# Patient Record
Sex: Female | Born: 1955
Health system: Southern US, Community
[De-identification: ages and names within clinical notes are randomized; demographics above are authoritative.]

## PROBLEM LIST (undated history)

## (undated) DIAGNOSIS — R519 Headache, unspecified: Secondary | ICD-10-CM

## (undated) DIAGNOSIS — M8580 Other specified disorders of bone density and structure, unspecified site: Secondary | ICD-10-CM

## (undated) DIAGNOSIS — C801 Malignant (primary) neoplasm, unspecified: Secondary | ICD-10-CM

## (undated) DIAGNOSIS — H02403 Unspecified ptosis of bilateral eyelids: Secondary | ICD-10-CM

## (undated) DIAGNOSIS — R51 Headache: Secondary | ICD-10-CM

## (undated) HISTORY — PX: TONSILLECTOMY: SUR1361

## (undated) HISTORY — PX: BREAST SURGERY: SHX581

## (undated) HISTORY — PX: SPINE SURGERY: SHX786

## (undated) HISTORY — PX: COSMETIC SURGERY: SHX468

## (undated) HISTORY — PX: APPENDECTOMY: SHX54

## (undated) HISTORY — PX: FOOT SURGERY: SHX648

## (undated) HISTORY — PX: BREAST EXCISIONAL BIOPSY: SUR124

## (undated) HISTORY — PX: EYE SURGERY: SHX253

---

## 1982-04-28 HISTORY — PX: HERNIA REPAIR: SHX51

## 1993-04-28 HISTORY — PX: ABDOMINAL HYSTERECTOMY: SHX81

## 1998-04-28 HISTORY — PX: AUGMENTATION MAMMAPLASTY: SUR837

## 1999-04-29 HISTORY — PX: CERVICAL DISC SURGERY: SHX588

## 2004-11-18 ENCOUNTER — Ambulatory Visit: Payer: Self-pay | Admitting: Unknown Physician Specialty

## 2006-02-09 ENCOUNTER — Ambulatory Visit: Payer: Self-pay | Admitting: Unknown Physician Specialty

## 2006-08-31 ENCOUNTER — Ambulatory Visit: Payer: Self-pay | Admitting: General Surgery

## 2007-02-22 ENCOUNTER — Ambulatory Visit: Payer: Self-pay | Admitting: Unknown Physician Specialty

## 2008-02-24 ENCOUNTER — Ambulatory Visit: Payer: Self-pay | Admitting: Unknown Physician Specialty

## 2008-02-28 ENCOUNTER — Ambulatory Visit: Payer: Self-pay | Admitting: Unknown Physician Specialty

## 2009-04-24 ENCOUNTER — Ambulatory Visit: Payer: Self-pay | Admitting: Unknown Physician Specialty

## 2010-05-07 ENCOUNTER — Ambulatory Visit: Payer: Self-pay | Admitting: Unknown Physician Specialty

## 2010-05-15 ENCOUNTER — Ambulatory Visit: Payer: Self-pay | Admitting: Unknown Physician Specialty

## 2011-06-10 ENCOUNTER — Ambulatory Visit: Payer: Self-pay | Admitting: Unknown Physician Specialty

## 2015-03-29 NOTE — Discharge Instructions (Signed)
INSTRUCTIONS FOLLOWING OCULOPLASTIC SURGERY °AMY M. FOWLER, MD ° °AFTER YOUR EYE SURGERY, THER ARE MANY THINGS THWIHC YOU, THE PATIENT, CAN DO TO ASSURE THE BEST POSSIBLE RESULT FROM YOUR OPERATION.  THIS SHEET SHOULD BE REFERRED TO WHENEVER QUESTIONS ARISE.  IF THERE ARE ANY QUESTIONS NOT ANSWERED HERE, DO NOT HESITATE TO CALL OUR OFFICE AT [phone] OR [phone].  THERE IS ALWAYS [facility] AVAILABLE TO CALL IF QUESTIONS OR PROBLEMS ARISE. ° °VISION: Your vision may be blurred and out of focus after surgery until you are able to stop using your ointment, swelling resolves and your eye(s) heal. This may take 1 to 2 weeks at the least.  If your vision becomes gradually more dim or dark, this is not normal and you need to call our office immediately. ° °EYE CARE: For the first 48 hours after surgery, use ice packs frequently - “20 minutes on, 20 minutes off” - to help reduce swelling and bruising.  Small bags of frozen peas or corn make good ice packs along with cloths soaked in ice water.  If you are wearing a patch or other type of dressing following surgery, keep this on for the amount of time specified by your doctor.  For the first week following surgery, you will need to treat your stitches with great care.  If is OK to shower, but take care to not allow soapy water to run into your eye(s) to help reduce changes of infection.  You may gently clean the eyelashes and around the eye(s) with cotton balls and sterile water, BUT DO NOT RUB THE STITCHES VIGOROUSLY.  Keeping your stitches moist with ointment will help promote healing with minimal scar formation. ° °ACTIVITY: When you leave the surgery center, you should go home, rest and be inactive.  The eye(s) may feel scratchy and keeping the eyes closed will allow for faster healing.  The first week following surgery, avoid straining (anything making the face turn red) or lifting over 20 pounds.  Additionally, avoid bending which causes your head to go below  your waist.  Using your eyes will NOT harm them, so feel free to read, watch television, use the computer, etc as desired.  Driving depends on each individual, so check with your doctor if you have questions about driving. ° °MEDICATIONS:  You will be given a prescription for an ointment to use 4 times a day on your stitches.  You can use the ointment in your eyes if they feel scratchy or irritated.  If you eyelid(s) don’t close completely when you sleep, put some ointment in your eyes before bedtime. ° °EMERGENCY: If you experience SEVERE EYE PAIN OR HEADACHE UNRELIEVED BY TYLENOL OR PERCOCET, NAUSEA OR VOMITING, WORSENING REDNESS, OR WORSENING VISION (ESPECIALLY VISION THAT WA INITIALLY BETTER) CALL [phone] OR [phone] DURING BUSINESS HOURS OR AFTER HOURS. ° °General Anesthesia, Adult, Care After °Refer to this sheet in the next few weeks. These instructions provide you with information on caring for yourself after your procedure. Your health care provider may also give you more specific instructions. Your treatment has been planned according to current medical practices, but problems sometimes occur. Call your health care provider if you have any problems or questions after your procedure. °WHAT TO EXPECT AFTER THE PROCEDURE °After the procedure, it is typical to experience: °· Sleepiness. °· Nausea and vomiting. °HOME CARE INSTRUCTIONS °· For the first 24 hours after general anesthesia: °¨ Have a responsible person with you. °¨ Do not drive a car. If you   are alone, do not take public transportation. °¨ Do not drink alcohol. °¨ Do not take medicine that has not been prescribed by your health care provider. °¨ Do not sign important papers or make important decisions. °¨ You may resume a normal diet and activities as directed by your health care provider. °· Change bandages (dressings) as directed. °· If you have questions or problems that seem related to general anesthesia, call the hospital and ask for  the anesthetist or anesthesiologist on call. °SEEK MEDICAL CARE IF: °· You have nausea and vomiting that continue the day after anesthesia. °· You develop a rash. °SEEK IMMEDIATE MEDICAL CARE IF:  °· You have difficulty breathing. °· You have chest pain. °· You have any allergic problems. °  °This information is not intended to replace advice given to you by your health care provider. Make sure you discuss any questions you have with your health care provider. °  °Document Released: 07/21/2000 Document Revised: 05/05/2014 Document Reviewed: 08/13/2011 °Elsevier Interactive Patient Education ©2016 [employer] ° °

## 2015-04-03 ENCOUNTER — Ambulatory Visit
Admission: RE | Admit: 2015-04-03 | Discharge: 2015-04-03 | Disposition: A | Discharge status: Home / Self Care | Payer: BLUE CROSS/BLUE SHIELD | Source: Ambulatory Visit | Attending: Ophthalmology | Admitting: Ophthalmology

## 2015-04-03 ENCOUNTER — Ambulatory Visit: Payer: BLUE CROSS/BLUE SHIELD | Admitting: Anesthesiology

## 2015-04-03 ENCOUNTER — Encounter
Admission: RE | Disposition: A | Discharge status: Home / Self Care | Payer: Self-pay | Source: Ambulatory Visit | Attending: Ophthalmology

## 2015-04-03 DIAGNOSIS — H02831 Dermatochalasis of right upper eyelid: Secondary | ICD-10-CM | POA: Insufficient documentation

## 2015-04-03 DIAGNOSIS — H02834 Dermatochalasis of left upper eyelid: Secondary | ICD-10-CM | POA: Insufficient documentation

## 2015-04-03 DIAGNOSIS — H02403 Unspecified ptosis of bilateral eyelids: Secondary | ICD-10-CM | POA: Insufficient documentation

## 2015-04-03 DIAGNOSIS — Z90710 Acquired absence of both cervix and uterus: Secondary | ICD-10-CM | POA: Diagnosis not present

## 2015-04-03 HISTORY — DX: Other specified disorders of bone density and structure, unspecified site: M85.80

## 2015-04-03 HISTORY — DX: Headache, unspecified: R51.9

## 2015-04-03 HISTORY — PX: BROW LIFT: SHX178

## 2015-04-03 HISTORY — PX: PTOSIS REPAIR: SHX6568

## 2015-04-03 HISTORY — DX: Unspecified ptosis of bilateral eyelids: H02.403

## 2015-04-03 HISTORY — DX: Headache: R51

## 2015-04-03 SURGERY — BLEPHAROPLASTY
Anesthesia: Monitor Anesthesia Care | Laterality: Bilateral | Wound class: Clean

## 2015-04-03 MED ORDER — BSS IO SOLN
INTRAOCULAR | Status: DC | PRN
  Administered 2015-04-03: 15 mL via INTRAOCULAR

## 2015-04-03 MED ORDER — BACITRACIN 500 UNIT/GM OP OINT: TOPICAL_OINTMENT | OPHTHALMIC | Status: DC

## 2015-04-03 MED ORDER — PROPOFOL 500 MG/50ML IV EMUL
INTRAVENOUS | Status: DC | PRN
  Administered 2015-04-03: 50 ug/kg/min via INTRAVENOUS

## 2015-04-03 MED ORDER — BACITRACIN 500 UNIT/GM OP OINT
TOPICAL_OINTMENT | OPHTHALMIC | Status: DC | PRN
  Administered 2015-04-03: 1 via OPHTHALMIC

## 2015-04-03 MED ORDER — MIDAZOLAM HCL 2 MG/2ML IJ SOLN
INTRAMUSCULAR | Status: DC | PRN
  Administered 2015-04-03: 2 mg via INTRAVENOUS

## 2015-04-03 MED ORDER — LIDOCAINE-EPINEPHRINE 2 %-1:100000 IJ SOLN
INTRAMUSCULAR | Status: DC | PRN
  Administered 2015-04-03: 2.5 mL via OPHTHALMIC
  Administered 2015-04-03: .5 mL via OPHTHALMIC

## 2015-04-03 MED ORDER — OXYCODONE-ACETAMINOPHEN 5-325 MG PO TABS
1.0000 | ORAL_TABLET | ORAL | Status: DC | PRN
Start: 2015-04-03 — End: 2016-10-09

## 2015-04-03 MED ORDER — FENTANYL CITRATE (PF) 100 MCG/2ML IJ SOLN
INTRAMUSCULAR | Status: DC | PRN
  Administered 2015-04-03: 100 ug via INTRAVENOUS

## 2015-04-03 MED ORDER — LACTATED RINGERS IV SOLN
INTRAVENOUS | Status: DC
  Administered 2015-04-03: 11:00:00 via INTRAVENOUS

## 2015-04-03 MED ORDER — TETRACAINE HCL 0.5 % OP SOLN
OPHTHALMIC | Status: DC | PRN
  Administered 2015-04-03: 2 [drp] via OPHTHALMIC

## 2015-04-03 SURGICAL SUPPLY — 36 items
APPLICATOR COTTON TIP WD 3 STR (MISCELLANEOUS) ×4 IMPLANT
BLADE SURG 15 STRL LF DISP TIS (BLADE) ×1 IMPLANT
BLADE SURG 15 STRL SS (BLADE) ×1
CORD BIP STRL DISP 12FT (MISCELLANEOUS) ×2 IMPLANT
DRAPE HEAD BAR (DRAPES) ×2 IMPLANT
GAUZE SPONGE 4X4 12PLY STRL (GAUZE/BANDAGES/DRESSINGS) ×2 IMPLANT
GAUZE SPONGE NON-WVN 2X2 STRL (MISCELLANEOUS) ×10 IMPLANT
GLOVE SURG LX 7.0 MICRO (GLOVE) ×2
GLOVE SURG LX STRL 7.0 MICRO (GLOVE) ×2 IMPLANT
MARKER SKIN XFINE TIP W/RULER (MISCELLANEOUS) ×2 IMPLANT
NEEDLE FILTER BLUNT 18X 1/2SAF (NEEDLE) ×1
NEEDLE FILTER BLUNT 18X1 1/2 (NEEDLE) ×1 IMPLANT
NEEDLE HYPO 30X.5 LL (NEEDLE) ×4 IMPLANT
PACK DRAPE NASAL/ENT (PACKS) ×2 IMPLANT
SOL PREP PVP 2OZ (MISCELLANEOUS) ×2
SOLUTION PREP PVP 2OZ (MISCELLANEOUS) ×1 IMPLANT
SPONGE VERSALON 2X2 STRL (MISCELLANEOUS) ×10
SUT CHROMIC 4-0 (SUTURE)
SUT CHROMIC 4-0 M2 12X2 ARM (SUTURE)
SUT CHROMIC 5 0 P 3 (SUTURE) IMPLANT
SUT ETHILON 4 0 CL P 3 (SUTURE) IMPLANT
SUT MERSILENE 4-0 S-2 (SUTURE) IMPLANT
SUT PDS AB 4-0 P3 18 (SUTURE) IMPLANT
SUT PLAIN GUT (SUTURE) ×4 IMPLANT
SUT PROLENE 5 0 P 3 (SUTURE) IMPLANT
SUT PROLENE 6 0 P 1 18 (SUTURE) ×4 IMPLANT
SUT SILK 4 0 G 3 (SUTURE) IMPLANT
SUT VIC AB 5-0 P-3 18X BRD (SUTURE) IMPLANT
SUT VIC AB 5-0 P3 18 (SUTURE)
SUT VICRYL 6-0  S14 CTD (SUTURE)
SUT VICRYL 6-0 S14 CTD (SUTURE) IMPLANT
SUT VICRYL 7 0 TG140 8 (SUTURE) IMPLANT
SUTURE CHRMC 4-0 M2 12X2 ARM (SUTURE) IMPLANT
SYR 3ML LL SCALE MARK (SYRINGE) ×2 IMPLANT
SYRINGE 10CC LL (SYRINGE) ×2 IMPLANT
WATER STERILE IRR 500ML POUR (IV SOLUTION) ×2 IMPLANT

## 2015-04-03 NOTE — Interval H&P Note (Signed)
History and Physical Interval Note:  04/03/2015 11:48 AM  Lisa Chen  has presented today for surgery, with the diagnosis of H02.831 H02.834 DERMATOCHALASIS H02.403 PTOSIS OF BOTH EYELIDS  The various methods of treatment have been discussed with the patient and family. After consideration of risks, benefits and other options for treatment, the patient has consented to  Procedure(s): BLEPHAROPLASTY (Bilateral) PTOSIS REPAIR (Bilateral) as a surgical intervention .  The patient's history has been reviewed, patient examined, no change in status, stable for surgery.  I have reviewed the patient's chart and labs.  Questions were answered to the patient's satisfaction.     Vickki Muff, Fallyn Munnerlyn M

## 2015-04-03 NOTE — Anesthesia Procedure Notes (Signed)
Procedure Name: MAC Performed by: Torrie Namba Pre-anesthesia Checklist: Patient identified, Emergency Drugs available, Suction available, Timeout performed and Patient being monitored Patient Re-evaluated:Patient Re-evaluated prior to inductionOxygen Delivery Method: Nasal cannula Placement Confirmation: positive ETCO2     

## 2015-04-03 NOTE — H&P (Signed)
  See history and physical completed at the [facility] on 03/16/2015.

## 2015-04-03 NOTE — Transfer of Care (Signed)
Immediate Anesthesia Transfer of Care Note  Patient: Lisa Chen  Procedure(s) Performed: Procedure(s): BLEPHAROPLASTY (Bilateral) PTOSIS REPAIR (Bilateral)  Patient Location: PACU  Anesthesia Type: MAC  Level of Consciousness: awake, alert  and patient cooperative  Airway and Oxygen Therapy: Patient Spontanous Breathing and Patient connected to supplemental oxygen  Post-op Assessment: Post-op Vital signs reviewed, Patient's Cardiovascular Status Stable, Respiratory Function Stable, Patent Airway and No signs of Nausea or vomiting  Post-op Vital Signs: Reviewed and stable  Complications: No apparent anesthesia complications

## 2015-04-03 NOTE — Anesthesia Postprocedure Evaluation (Signed)
Anesthesia Post Note  Patient: Lisa Chen  Procedure(s) Performed: Procedure(s) (LRB): BLEPHAROPLASTY (Bilateral) PTOSIS REPAIR (Bilateral)  Patient location during evaluation: PACU Anesthesia Type: General Level of consciousness: awake and alert Pain management: pain level controlled Vital Signs Assessment: post-procedure vital signs reviewed and stable Respiratory status: spontaneous breathing, nonlabored ventilation, respiratory function stable and patient connected to nasal cannula oxygen Cardiovascular status: blood pressure returned to baseline and stable Postop Assessment: no signs of nausea or vomiting Anesthetic complications: no    Marshell Levan

## 2015-04-03 NOTE — Anesthesia Preprocedure Evaluation (Signed)
Anesthesia Evaluation  Patient identified by MRN, date of birth, ID band  Airway Mallampati: I  TM Distance: >3 FB Neck ROM: Full    Dental   Pulmonary           Cardiovascular      Neuro/Psych    GI/Hepatic   Endo/Other    Renal/GU      Musculoskeletal   Abdominal   Peds  Hematology   Anesthesia Other Findings   Reproductive/Obstetrics                             Anesthesia Physical Anesthesia Plan  ASA: I  Anesthesia Plan: MAC   Post-op Pain Management:    Induction: Intravenous  Airway Management Planned:   Additional Equipment:   Intra-op Plan:   Post-operative Plan:   Informed Consent: I have reviewed the patients History and Physical, chart, labs and discussed the procedure including the risks, benefits and alternatives for the proposed anesthesia with the patient or authorized representative who has indicated his/her understanding and acceptance.     Plan Discussed with: CRNA  Anesthesia Plan Comments:         Anesthesia Quick Evaluation

## 2015-04-03 NOTE — Op Note (Signed)
Preoperative Diagnosis:  1. Visually significant blepharoptosis both Upper Eyelid(s) 2. Visually significant dermatochalasis both Upper Eyelid(s)  Postoperative Diagnosis:  Same.  Procedure(s) Performed:   1. Blepharoptosis repair with levator aponeurosis advancement both Upper Eyelid(s) 2. Upper eyelid blepharoplasty with excess skin excision  both Upper Eyelid(s)  Teaching Surgeon: Philis Pique. Vickki Muff, M.D.  Assistants: none  Anesthesia: MAC  Specimens: None.  Estimated Blood Loss: Minimal.  Complications: None.  Operative Findings: None Dictated  Procedure:   Allergies were reviewed and the patient Erythromycin and Other.   After the risks, benefits, complications and alternatives were discussed with the patient, appropriate informed consent was obtained.  While seated in an upright position and looking in primary gaze, the mid pupillary line was marked on the upper eyelid margins bilaterally. The patient was then brought to the operating suite and reclined supine.  Timeout was conducted and the patient was sedated.  Local anesthetic consisting of a 50-50 mixture of 2% lidocaine with epinephrine and 0.75% bupivacaine with added Hylenex was injected subcutaneously to both upper eyelid(s). After adequate local was instilled, the patient was prepped and draped in the usual sterile fashion for eyelid surgery.   Attention was turned to the upper eyelids. A 9 mm upper eyelid crease incision line was marked with calipers on both upper eyelid(s).  A pinch test was used to estimate the amount of excess skin to remove and this was marked in standard blepharoplasty style fashion. Attention was turned to the  right upper eyelid. A #15 blade was used to open the premarked incision line. A skin only flap was excised and hemostasis was obtained with bipolar cautery.   Westcott scissors were then used to transect through orbicularis down to the tarsal plate. Epitarsus was dissected to create a  smooth surface to suture to. Dissection was then carried superiorly in the plane between orbicularis and orbital septum. Once the preaponeurotic fat pocket was identified, the orbital septum was opened. This revealed the levator and its aponeurosis.    Attention was then turned to the opposite eyelid where the same procedure was performed in the same manner. Hemostasis was obtained with bipolar cautery throughout.   3 interrupted 6-0 Prolene sutures were then passed partial thickness through the tarsal plates of both upper eyelid(s). These sutures were placed in line with the mid pupillary, medial limbal, and lateral limbal lines. The sutures were fixed to the levator aponeurosis and adjusted until a nice lid height and contour were achieved. Once nice symmetry was achieved, the skin incisions were closed with a running 6-0 fast absorbing plain suture. The patient tolerated the procedure well.  Bacitracin ophthalmic ointment was applied to the incision site(s) followed by ice packs. The patient was taken to the recovery area where she recovered without difficulty.  Post-Op Plan/Instructions:  The patient was instructed to use ice packs frequently for the next 48 hours. She was instructed to use bacitracin ophthalmic ointment on her incisions 4 times a day for the next 12 to 14 days. She was given a prescription for Percocet for pain control should Tylenol not be effective. She was asked to to follow up at the [facility] in [place], [place] in 2 weeks' time or sooner as needed for problems.  Teaching Surgeon Attestation: None  Levy Wellman M. Vickki Muff, M.D. Attending,Ophthalmology

## 2015-04-04 ENCOUNTER — Encounter: Payer: Self-pay | Admitting: Ophthalmology

## 2015-07-31 ENCOUNTER — Other Ambulatory Visit: Payer: Self-pay | Admitting: Unknown Physician Specialty

## 2015-07-31 DIAGNOSIS — M8580 Other specified disorders of bone density and structure, unspecified site: Secondary | ICD-10-CM

## 2015-07-31 DIAGNOSIS — Z01419 Encounter for gynecological examination (general) (routine) without abnormal findings: Secondary | ICD-10-CM | POA: Diagnosis not present

## 2015-07-31 DIAGNOSIS — Z1231 Encounter for screening mammogram for malignant neoplasm of breast: Secondary | ICD-10-CM | POA: Diagnosis not present

## 2015-08-01 ENCOUNTER — Other Ambulatory Visit: Payer: Self-pay | Admitting: Unknown Physician Specialty

## 2015-08-01 DIAGNOSIS — R928 Other abnormal and inconclusive findings on diagnostic imaging of breast: Secondary | ICD-10-CM

## 2015-08-02 ENCOUNTER — Other Ambulatory Visit: Payer: Self-pay | Admitting: *Deleted

## 2015-08-02 ENCOUNTER — Inpatient Hospital Stay
Admission: RE | Admit: 2015-08-02 | Discharge: 2015-08-02 | Disposition: A | Discharge status: Home / Self Care | Payer: Self-pay | Source: Ambulatory Visit | Attending: *Deleted | Admitting: *Deleted

## 2015-08-02 DIAGNOSIS — Z9289 Personal history of other medical treatment: Secondary | ICD-10-CM

## 2015-08-03 LAB — HM PAP SMEAR: HM Pap smear: NEGATIVE

## 2015-08-15 ENCOUNTER — Ambulatory Visit: Payer: BLUE CROSS/BLUE SHIELD

## 2015-08-16 ENCOUNTER — Ambulatory Visit
Admission: RE | Admit: 2015-08-16 | Discharge: 2015-08-16 | Disposition: A | Discharge status: Home / Self Care | Payer: BLUE CROSS/BLUE SHIELD | Source: Ambulatory Visit | Attending: Unknown Physician Specialty | Admitting: Unknown Physician Specialty

## 2015-08-16 DIAGNOSIS — M8580 Other specified disorders of bone density and structure, unspecified site: Secondary | ICD-10-CM | POA: Diagnosis not present

## 2015-08-16 DIAGNOSIS — N6001 Solitary cyst of right breast: Secondary | ICD-10-CM | POA: Insufficient documentation

## 2015-08-16 DIAGNOSIS — N6011 Diffuse cystic mastopathy of right breast: Secondary | ICD-10-CM | POA: Diagnosis not present

## 2015-08-16 DIAGNOSIS — R928 Other abnormal and inconclusive findings on diagnostic imaging of breast: Secondary | ICD-10-CM | POA: Insufficient documentation

## 2015-08-16 DIAGNOSIS — Z13820 Encounter for screening for osteoporosis: Secondary | ICD-10-CM | POA: Diagnosis not present

## 2015-08-16 DIAGNOSIS — Z9882 Breast implant status: Secondary | ICD-10-CM | POA: Diagnosis not present

## 2015-08-16 DIAGNOSIS — M8589 Other specified disorders of bone density and structure, multiple sites: Secondary | ICD-10-CM | POA: Diagnosis not present

## 2016-01-02 DIAGNOSIS — Z85820 Personal history of malignant melanoma of skin: Secondary | ICD-10-CM | POA: Diagnosis not present

## 2016-01-14 DIAGNOSIS — Z23 Encounter for immunization: Secondary | ICD-10-CM | POA: Diagnosis not present

## 2016-07-02 DIAGNOSIS — L570 Actinic keratosis: Secondary | ICD-10-CM | POA: Diagnosis not present

## 2016-07-02 DIAGNOSIS — Z85820 Personal history of malignant melanoma of skin: Secondary | ICD-10-CM | POA: Diagnosis not present

## 2016-08-06 ENCOUNTER — Telehealth: Payer: Self-pay

## 2016-08-06 NOTE — Telephone Encounter (Signed)
Since I have not seen this pt, I am unable to refill medication.  Need to have pt/doc relationship to prescribe medication.

## 2016-08-06 NOTE — Telephone Encounter (Signed)
Fax received from [facility] states that Dr. Vernie Ammons had ben giving script for Minivelle 0.0375 mcg/ 24 hr patch #8 one patch twice weekly for 30 days. Patient has app to establish care with you on 10-09-16. You have never seen patient. Pharmacy is requesting that you refill for 2 months until she establishes care here? Please advise

## 2016-08-07 NOTE — Telephone Encounter (Signed)
Called pharmacy and let them know they will call patient.

## 2016-08-08 ENCOUNTER — Telehealth: Payer: Self-pay | Admitting: Obstetrics & Gynecology

## 2016-08-08 NOTE — Telephone Encounter (Signed)
Prescription not authorized. Pt needs appointment due to Dr. Ammie Dalton is no longer with [facility]

## 2016-08-19 DIAGNOSIS — H902 Conductive hearing loss, unspecified: Secondary | ICD-10-CM | POA: Diagnosis not present

## 2016-08-19 DIAGNOSIS — H6123 Impacted cerumen, bilateral: Secondary | ICD-10-CM | POA: Diagnosis not present

## 2016-08-19 DIAGNOSIS — H903 Sensorineural hearing loss, bilateral: Secondary | ICD-10-CM | POA: Diagnosis not present

## 2016-10-09 ENCOUNTER — Encounter: Payer: Self-pay | Admitting: Internal Medicine

## 2016-10-09 ENCOUNTER — Ambulatory Visit (INDEPENDENT_AMBULATORY_CARE_PROVIDER_SITE_OTHER): Payer: BLUE CROSS/BLUE SHIELD

## 2016-10-09 ENCOUNTER — Ambulatory Visit (INDEPENDENT_AMBULATORY_CARE_PROVIDER_SITE_OTHER): Payer: BLUE CROSS/BLUE SHIELD | Admitting: Internal Medicine

## 2016-10-09 VITALS — BP 118/74 | HR 83 | Temp 98.6°F | Resp 12 | Ht 62.21 in | Wt 121.4 lb

## 2016-10-09 DIAGNOSIS — M8580 Other specified disorders of bone density and structure, unspecified site: Secondary | ICD-10-CM | POA: Diagnosis not present

## 2016-10-09 DIAGNOSIS — R1030 Lower abdominal pain, unspecified: Secondary | ICD-10-CM

## 2016-10-09 DIAGNOSIS — E7800 Pure hypercholesterolemia, unspecified: Secondary | ICD-10-CM

## 2016-10-09 DIAGNOSIS — Z79899 Other long term (current) drug therapy: Secondary | ICD-10-CM | POA: Diagnosis not present

## 2016-10-09 DIAGNOSIS — M545 Low back pain, unspecified: Secondary | ICD-10-CM

## 2016-10-09 DIAGNOSIS — Z800 Family history of malignant neoplasm of digestive organs: Secondary | ICD-10-CM

## 2016-10-09 DIAGNOSIS — R928 Other abnormal and inconclusive findings on diagnostic imaging of breast: Secondary | ICD-10-CM | POA: Diagnosis not present

## 2016-10-09 DIAGNOSIS — Z79818 Long term (current) use of other agents affecting estrogen receptors and estrogen levels: Secondary | ICD-10-CM

## 2016-10-09 LAB — LIPID PANEL
Cholesterol: 226 mg/dL — ABNORMAL HIGH (ref 0–200)
HDL: 74.6 mg/dL (ref >39.00)
LDL Cholesterol: 138 mg/dL — ABNORMAL HIGH (ref 0–99)
NONHDL: 151.47
Total CHOL/HDL Ratio: 3
Triglycerides: 66 mg/dL (ref 0.0–149.0)
VLDL: 13.2 mg/dL (ref 0.0–40.0)

## 2016-10-09 LAB — URINALYSIS, ROUTINE W REFLEX MICROSCOPIC
Bilirubin Urine: NEGATIVE
HGB URINE DIPSTICK: NEGATIVE
KETONES UR: NEGATIVE
LEUKOCYTES UA: NEGATIVE
NITRITE: NEGATIVE
RBC / HPF: NONE SEEN (ref 0–?)
SPECIFIC GRAVITY, URINE: 1.015 (ref 1.000–1.030)
Total Protein, Urine: NEGATIVE
URINE GLUCOSE: NEGATIVE
Urobilinogen, UA: 0.2 (ref 0.0–1.0)
WBC, UA: NONE SEEN (ref 0–?)
pH: 8 (ref 5.0–8.0)

## 2016-10-09 LAB — CBC WITH DIFFERENTIAL/PLATELET
Basophils Absolute: 0 10*3/uL (ref 0.0–0.1)
Basophils Relative: 0.5 % (ref 0.0–3.0)
EOS PCT: 0.9 % (ref 0.0–5.0)
Eosinophils Absolute: 0 10*3/uL (ref 0.0–0.7)
HCT: 37.9 % (ref 36.0–46.0)
HEMOGLOBIN: 12.8 g/dL (ref 12.0–15.0)
Lymphocytes Relative: 31 % (ref 12.0–46.0)
Lymphs Abs: 1.5 10*3/uL (ref 0.7–4.0)
MCHC: 33.7 g/dL (ref 30.0–36.0)
MCV: 93.7 fl (ref 78.0–100.0)
MONO ABS: 0.6 10*3/uL (ref 0.1–1.0)
Monocytes Relative: 12.7 % — ABNORMAL HIGH (ref 3.0–12.0)
Neutro Abs: 2.7 10*3/uL (ref 1.4–7.7)
Neutrophils Relative %: 54.9 % (ref 43.0–77.0)
Platelets: 262 10*3/uL (ref 150.0–400.0)
RBC: 4.04 Mil/uL (ref 3.87–5.11)
RDW: 12.2 % (ref 11.5–15.5)
WBC: 4.9 10*3/uL (ref 4.0–10.5)

## 2016-10-09 LAB — COMPREHENSIVE METABOLIC PANEL
ALBUMIN: 4.3 g/dL (ref 3.5–5.2)
ALT: 17 U/L (ref 0–35)
AST: 20 U/L (ref 0–37)
Alkaline Phosphatase: 52 U/L (ref 39–117)
BUN: 16 mg/dL (ref 6–23)
CO2: 28 mEq/L (ref 19–32)
CREATININE: 0.68 mg/dL (ref 0.40–1.20)
Calcium: 9.3 mg/dL (ref 8.4–10.5)
Chloride: 104 mEq/L (ref 96–112)
GFR: 93.07 mL/min (ref >60.00)
Glucose, Bld: 88 mg/dL (ref 70–99)
Potassium: 4.4 mEq/L (ref 3.5–5.1)
SODIUM: 138 meq/L (ref 135–145)
TOTAL PROTEIN: 7.2 g/dL (ref 6.0–8.3)
Total Bilirubin: 1 mg/dL (ref 0.2–1.2)

## 2016-10-09 LAB — TSH: TSH: 1.74 u[IU]/mL (ref 0.35–4.50)

## 2016-10-09 MED ORDER — ESTRADIOL 0.0375 MG/24HR TD PTTW: 1.0000 | MEDICATED_PATCH | TRANSDERMAL | 3 refills | Status: DC

## 2016-10-09 NOTE — Progress Notes (Signed)
Patient ID: Lisa Chen, female   DOB: 10/17/1955, 61 y.o.   MRN: [id number]   Subjective:    Patient ID: Lisa Chen, female    DOB: 02-17-1956, 61 y.o.   MRN: [id number]  HPI  Patient here to establish care.  She has a history of hypercholesterolemia.  She has been followed by Dr Lisa Chen.  She reports she is doing well.  Tries to stay active.  No chest pain.  No sob.  No acid reflux.  No abdominal pain.  Bowels moving.  She is s/p hysterectomy secondary to endometriosis.  Up to date with colonoscopy.  Had maternal uncle with colon cancer and maternal niece with colon cancer.  Some soreness in her right lower back.  Noticed after sitting a long period of time.  She is using estrogen patch.  Feels better on estrogen.  Has tried to stop previously.  Did not feel well.  Wants to remain on her estrogen.     Past Medical History:  Diagnosis Date  . Headache    3/WEEK, / DUE TO EYELIDS  . Osteopenia    NOT PROGRESSED IN LAST 10 YEARS  . Ptosis, both eyelids    UPPER   Past Surgical History:  Procedure Laterality Date  . ABDOMINAL HYSTERECTOMY  1995  . AUGMENTATION MAMMAPLASTY Bilateral 2000   SALINE  . BREAST EXCISIONAL BIOPSY Right 1990's   NEG  . BREAST SURGERY Right    BIOPSY  . BROW LIFT Bilateral 04/03/2015   Procedure: BLEPHAROPLASTY;  Surgeon: Karle Starch, MD;  Location: [place];  Service: Ophthalmology;  Laterality: Bilateral;  . [place] SURGERY  2001  . COSMETIC SURGERY     SKIN CANCER-MELANOMA ON BACK REMOVED-BASAL CELL ON CHEST ALSO  . EYE SURGERY Bilateral 2000 AND 2002   CATARACTS  . FOOT SURGERY    . HERNIA REPAIR  1984  . PTOSIS REPAIR Bilateral 04/03/2015   Procedure: PTOSIS REPAIR;  Surgeon: Karle Starch, MD;  Location: [place];  Service: Ophthalmology;  Laterality: Bilateral;  . TONSILLECTOMY     Family History  Problem Relation Age of Onset  . Arthritis Mother   . Heart disease Mother   . Diabetes Mother   . Heart  disease Maternal Grandmother   . Heart disease Maternal Grandfather   . Diabetes Maternal Grandfather   . Breast cancer Neg Hx    Social History   Social History  . Marital status: Married    Spouse name: N/A  . Number of children: N/A  . Years of education: N/A   Social History Main Topics  . Smoking status: Never Smoker  . Smokeless tobacco: Never Used  . Alcohol use 1.8 oz/week    3 Glasses of wine per week     Comment: OCCAS  . Drug use: No  . Sexual activity: Not Asked   Other Topics Concern  . None   Social History Narrative  . None    Outpatient Encounter Prescriptions as of 10/09/2016  Medication Sig  . estradiol (VIVELLE-DOT) 0.0375 MG/24HR Place 1 patch onto the skin 2 (two) times a week.  . [DISCONTINUED] estradiol (VIVELLE-DOT) 0.025 MG/24HR Place 1 patch onto the skin 2 (two) times a week.  . [DISCONTINUED] estradiol (VIVELLE-DOT) 0.0375 MG/24HR Place 1 patch onto the skin 2 (two) times a week.  . [DISCONTINUED] bacitracin ophthalmic ointment Use on sutures 4 times a day for 12-14 days  . [DISCONTINUED] estradiol (VIVELLE-DOT) 0.05 MG/24HR patch Place 1 patch  onto the skin 2 (two) times a week.  . [DISCONTINUED] Multiple Vitamin (MULTIVITAMIN) tablet Take 1 tablet by mouth daily.  . [DISCONTINUED] oxyCODONE-acetaminophen (PERCOCET) 5-325 MG tablet Take 1 tablet by mouth every 4 (four) hours as needed for severe pain.   No facility-administered encounter medications on file as of 10/09/2016.     Review of Systems  Constitutional: Negative for appetite change and unexpected weight change.  HENT: Negative for congestion and sinus pressure.   Respiratory: Negative for cough, chest tightness and shortness of breath.   Cardiovascular: Negative for chest pain, palpitations and leg swelling.  Gastrointestinal: Negative for abdominal pain, diarrhea, nausea and vomiting.  Genitourinary: Negative for difficulty urinating and dysuria.  Musculoskeletal: Negative for  back pain and joint swelling.  Skin: Negative for color change and rash.  Neurological: Negative for dizziness, light-headedness and headaches.  Psychiatric/Behavioral: Negative for agitation and dysphoric mood.       Objective:    Physical Exam  Constitutional: She appears well-developed and well-nourished. No distress.  HENT:  Nose: Nose normal.  Mouth/Throat: Oropharynx is clear and moist.  Neck: Neck supple. No thyromegaly present.  Cardiovascular: Normal rate and regular rhythm.   Pulmonary/Chest: Breath sounds normal. No respiratory distress. She has no wheezes.  Abdominal: Soft. Bowel sounds are normal. There is no tenderness.  Musculoskeletal: She exhibits no edema or tenderness.  Lymphadenopathy:    She has no cervical adenopathy.  Skin: No rash noted. No erythema.  Psychiatric: She has a normal mood and affect. Her behavior is normal.    BP 118/74 (BP Location: Left Arm, Patient Position: Sitting, Cuff Size: Normal)   Pulse 83   Temp 98.6 F (37 C) (Oral)   Resp 12   Ht 5' 2.21" (1.58 m)   Wt 121 lb 6.4 oz (55.1 kg)   SpO2 95%   BMI 22.06 kg/m  Wt Readings from Last 3 Encounters:  10/09/16 121 lb 6.4 oz (55.1 kg)  04/03/15 122 lb (55.3 kg)    Dg Bone Density  Result Date: 08/16/2015 EXAM: DUAL X-RAY ABSORPTIOMETRY (DXA) FOR BONE MINERAL DENSITY IMPRESSION: Dear Lisa Chen, Your patient Lisa Chen completed a BMD test on 08/16/2015 using the [place] (analysis version: 14.10) manufactured by [employer]. The following summarizes the results of our evaluation. PATIENT BIOGRAPHICAL: Name: Lisa Chen Patient ID: [id number] Birth Date: 15-Jun-1955 Height: 62.5 in. Gender: Female Exam Date: 08/16/2015 Weight: 119.0 lbs. Indications: Caucasian, Hysterectomy, Oophorectomy Bilateral Fractures: Treatments: Naturethroid, Vit D ASSESSMENT: The BMD measured at AP Spine L1-L4 is 0.997 g/cm2 with a T-score of -1.6. This patient is considered  osteopenic according to [facility]) criteria. Site Region Measured Measured WHO Young Adult BMD Date       Age      Classification T-score AP Spine L1-L4 08/16/2015 61.2 Osteopenia -1.6 0.997 g/cm2 AP Spine L1-L4 05/07/2010 55.9 Osteopenia -2.0 0.945 g/cm2 DualFemur Neck Left 08/16/2015 61.2 Osteopenia -1.2 0.878 g/cm2 DualFemur Neck Left 05/07/2010 55.9 Normal -0.9 0.908 g/cm2 World Health Organization [facility]) criteria for post-menopausal, Caucasian Women: Normal:       T-score at or above -1 SD Osteopenia:   T-score between -1 and -2.5 SD Osteoporosis: T-score at or below -2.5 SD RECOMMENDATIONS: [place] recommends that FDA-approved medical therapies be considered in postmenopausal women and men age 13 or older with a: 1. Hip or vertebral (clinical or morphometric) fracture. 2. T-score of < -2.5 at the spine or hip. 3. Ten-year fracture probability  by FRAX of 3% or greater for hip fracture or 20% or greater for major osteoporotic fracture. All treatment decisions require clinical judgment and consideration of individual patient factors, including patient preferences, co-morbidities, previous drug use, risk factors not captured in the FRAX model (e.g. falls, vitamin D deficiency, increased bone turnover, interval significant decline in bone density) and possible under - or over-estimation of fracture risk by FRAX. All patients should ensure an adequate intake of dietary calcium (1200 mg/d) and vitamin D (800 IU daily) unless contraindicated. FOLLOW-UP: People with diagnosed cases of osteoporosis or at high risk for fracture should have regular bone mineral density tests. For patients eligible for Medicare, routine testing is allowed once every 2 years. The testing frequency can be increased to one year for patients who have rapidly progressing disease, those who are receiving or discontinuing medical therapy to restore bone mass, or have additional risk factors. I have  reviewed this report, and agree with the above findings. [facility] Radiology Dear Lisa Chen, Your patient LORRIN BODNER completed a FRAX assessment on 08/16/2015 using the [place] (analysis version: 14.10) manufactured by [employer]. The following summarizes the results of our evaluation. PATIENT BIOGRAPHICAL: Name: Mee, Macdonnell Patient ID: [id number] Birth Date: [birth year] Height:    62.5 in. Gender:     Female    Age:        60.2       Weight:    119.0 lbs. Ethnicity:  White                            Exam Date: 08/16/2015 FRAX* RESULTS:  (version: 3.5) 10-year Probability of Fracture1 Major Osteoporotic Fracture2 Hip Fracture 6.9% 0.5% Population: [place] (Caucasian) Risk Factors: None Based on Femur (Left) Neck BMD 1 -The 10-year probability of fracture may be lower than reported if the patient has received treatment. 2 -Major Osteoporotic Fracture: Clinical Spine, Forearm, Hip or Shoulder *FRAX is a Materials engineer of the [employer] for Metabolic Bone Disease, a [place] (WHO) [facility]. ASSESSMENT: The probability of a major osteoporotic fracture is 6.9% within the next ten years. The probability of a hip fracture is 0.5% within the next ten years. . Electronically Signed   By: Ilona Sorrel M.D.   On: 08/16/2015 11:04   US Breast Ltd Uni Right Inc Axilla  Result Date: 08/16/2015 CLINICAL DATA:  Callback from screening mammogram for possible right breast mass EXAM: 2D DIGITAL DIAGNOSTIC RIGHT MAMMOGRAM WITH CAD AND ADJUNCT TOMO ULTRASOUND RIGHT BREAST COMPARISON:  Previous exam(s). ACR Breast Density Category b: There are scattered areas of fibroglandular density. FINDINGS: CC and MLO implant displaced views of the right breast were performed with tomosynthesis. On the additional views, there is a persistent asymmetry within the inferior left breast, corresponding to the possible abnormality identified on screening  mammogram. The breast parenchymal pattern in this region appears similar to the 2013 exam. In addition, an asymmetry within the subareolar right breast, middle depth appears similar to the 2010 and 2012 exams. Mammographic images were processed with CAD. On physical exam, no discrete mass is felt in the areas of concern within the inferior and subareolar right breast. Targeted ultrasound of the inferior and subareolar right breast was performed demonstrating a probable area of fibrocystic change at 6 o'clock, 3 cm from the nipple measuring 7 x 4 x 5 mm, which is thought to correspond to the  finding seen on screening mammogram within the inferior right breast. Similar-appearing smaller areas of fibrocystic change are also noted scattered throughout the imaged breast. Two simple cysts are incidentally noted at 12 o'clock, 1 cm from the nipple measuring 7 and 6 mm. No suspicious cystic or solid sonographic finding is seen in the areas of concern in the subareolar or inferior right breast. IMPRESSION: Probably benign right breast findings. RECOMMENDATION: Right diagnostic mammogram and ultrasound in 6 months. I have discussed the findings and recommendations with the patient. Results were also provided in writing at the conclusion of the visit. If applicable, a reminder letter will be sent to the patient regarding the next appointment. BI-RADS CATEGORY  3: Probably benign. Electronically Signed   By: Pamelia Hoit M.D.   On: 08/16/2015 11:06   Mm Diag Breast Tomo Uni Right  Result Date: 08/16/2015 CLINICAL DATA:  Callback from screening mammogram for possible right breast mass EXAM: 2D DIGITAL DIAGNOSTIC RIGHT MAMMOGRAM WITH CAD AND ADJUNCT TOMO ULTRASOUND RIGHT BREAST COMPARISON:  Previous exam(s). ACR Breast Density Category b: There are scattered areas of fibroglandular density. FINDINGS: CC and MLO implant displaced views of the right breast were performed with tomosynthesis. On the additional views, there is a  persistent asymmetry within the inferior left breast, corresponding to the possible abnormality identified on screening mammogram. The breast parenchymal pattern in this region appears similar to the 2013 exam. In addition, an asymmetry within the subareolar right breast, middle depth appears similar to the 2010 and 2012 exams. Mammographic images were processed with CAD. On physical exam, no discrete mass is felt in the areas of concern within the inferior and subareolar right breast. Targeted ultrasound of the inferior and subareolar right breast was performed demonstrating a probable area of fibrocystic change at 6 o'clock, 3 cm from the nipple measuring 7 x 4 x 5 mm, which is thought to correspond to the finding seen on screening mammogram within the inferior right breast. Similar-appearing smaller areas of fibrocystic change are also noted scattered throughout the imaged breast. Two simple cysts are incidentally noted at 12 o'clock, 1 cm from the nipple measuring 7 and 6 mm. No suspicious cystic or solid sonographic finding is seen in the areas of concern in the subareolar or inferior right breast. IMPRESSION: Probably benign right breast findings. RECOMMENDATION: Right diagnostic mammogram and ultrasound in 6 months. I have discussed the findings and recommendations with the patient. Results were also provided in writing at the conclusion of the visit. If applicable, a reminder letter will be sent to the patient regarding the next appointment. BI-RADS CATEGORY  3: Probably benign. Electronically Signed   By: Pamelia Hoit M.D.   On: 08/16/2015 11:06       Assessment & Plan:   Problem List Items Addressed This Visit    Abnormal mammogram - Primary    Mammogram 08/16/15 - Birads III.  Recommended right breast mammogram and ultrasound.  She never had.  Schedule for bilateral diagnostic mammogram and ultrasound.        Relevant Orders   MM DIAG BREAST TOMO BILATERAL   TSH (Completed)   Lipid panel  (Completed)   US BREAST LTD UNI LEFT INC AXILLA   US BREAST LTD UNI RIGHT INC AXILLA   Current use of estrogen therapy    She has tried to stop previously.  Feels better on estrogen.  Follow.       Family history of colon cancer    States up to date with  colonoscopy.  Obtain results.        Hypercholesterolemia    Low cholesterol diet and exercise.  Follow lipid panel.        Low back pain    Low back pain that occasionally radiates around to lower abdomen.  Check L-S spine xray.  No abdominal pain on exam.  Check urinalysis.        Relevant Orders   DG Lumbar Spine 2-3 Views (Completed)   Osteopenia    Weight bearing exercise.  On estrogen.  Calcium and vitamin D.        Other Visit Diagnoses    Lower abdominal pain       Relevant Orders   CBC with Differential/Platelet (Completed)   Comprehensive metabolic panel (Completed)   Urinalysis, Routine w reflex microscopic (Completed)     I spent 45 minutes with the patient and more than 50% of the time was spent in consultation regarding the above.  Time spent obtaining history, discussing current issues and symptoms and discussing plans for treatment and further w/up and f/u.     Einar Pheasant, MD

## 2016-10-09 NOTE — Progress Notes (Signed)
Pre-visit discussion using our clinic review tool. No additional management support is needed unless otherwise documented below in the visit note.  

## 2016-10-11 ENCOUNTER — Encounter: Payer: Self-pay | Admitting: Internal Medicine

## 2016-10-11 DIAGNOSIS — M545 Low back pain, unspecified: Secondary | ICD-10-CM | POA: Insufficient documentation

## 2016-10-11 DIAGNOSIS — M8580 Other specified disorders of bone density and structure, unspecified site: Secondary | ICD-10-CM | POA: Insufficient documentation

## 2016-10-11 DIAGNOSIS — Z79899 Other long term (current) drug therapy: Secondary | ICD-10-CM | POA: Insufficient documentation

## 2016-10-11 DIAGNOSIS — R928 Other abnormal and inconclusive findings on diagnostic imaging of breast: Secondary | ICD-10-CM | POA: Insufficient documentation

## 2016-10-11 DIAGNOSIS — M810 Age-related osteoporosis without current pathological fracture: Secondary | ICD-10-CM | POA: Insufficient documentation

## 2016-10-11 DIAGNOSIS — Z800 Family history of malignant neoplasm of digestive organs: Secondary | ICD-10-CM | POA: Insufficient documentation

## 2016-10-11 NOTE — Assessment & Plan Note (Signed)
Mammogram 08/16/15 - Birads III.  Recommended right breast mammogram and ultrasound.  She never had.  Schedule for bilateral diagnostic mammogram and ultrasound.

## 2016-10-11 NOTE — Assessment & Plan Note (Signed)
Low back pain that occasionally radiates around to lower abdomen.  Check L-S spine xray.  No abdominal pain on exam.  Check urinalysis.

## 2016-10-11 NOTE — Assessment & Plan Note (Signed)
States up to date with colonoscopy.  Obtain results.

## 2016-10-11 NOTE — Assessment & Plan Note (Signed)
She has tried to stop previously.  Feels better on estrogen.  Follow.

## 2016-10-11 NOTE — Assessment & Plan Note (Signed)
Weight bearing exercise.  On estrogen.  Calcium and vitamin D.

## 2016-10-11 NOTE — Assessment & Plan Note (Signed)
Low cholesterol diet and exercise.  Follow lipid panel.   

## 2016-10-12 ENCOUNTER — Other Ambulatory Visit: Payer: Self-pay | Admitting: Internal Medicine

## 2016-10-12 DIAGNOSIS — M545 Low back pain, unspecified: Secondary | ICD-10-CM

## 2016-10-12 NOTE — Progress Notes (Signed)
Order placed for referral to physical therapy.  

## 2016-10-28 IMAGING — US US BREAST*R* LIMITED INC AXILLA
1 series · 13 of 17 positions shown · non-contrast
Comparison: Previous exam(s).

CLINICAL DATA: Callback from screening mammogram for possible right
breast mass

EXAM:
2D DIGITAL DIAGNOSTIC RIGHT MAMMOGRAM WITH CAD AND ADJUNCT TOMO
ULTRASOUND RIGHT BREAST

[Series 1: us breast*right* limited inc axilla · 0.05mm/px · 13 of 17 slices shown]
[im 1/17]
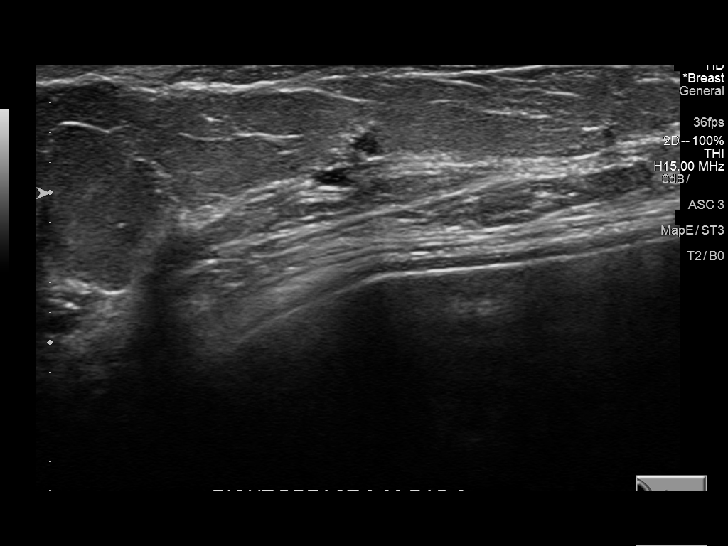
[im 2/17]
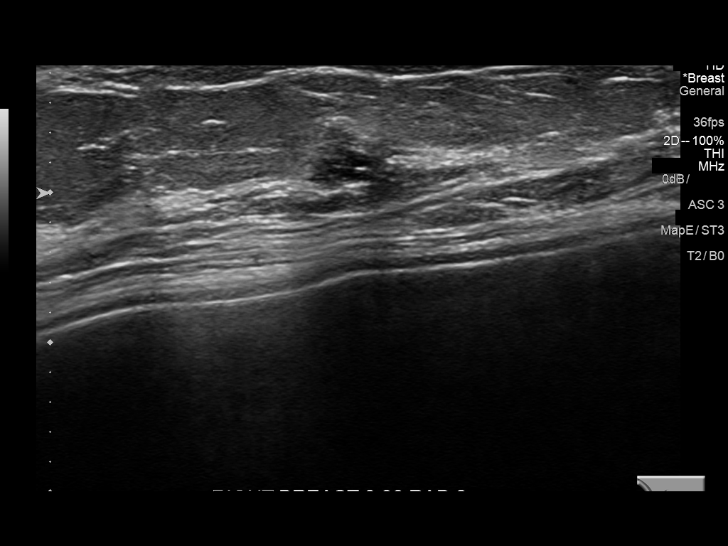
[im 4/17]
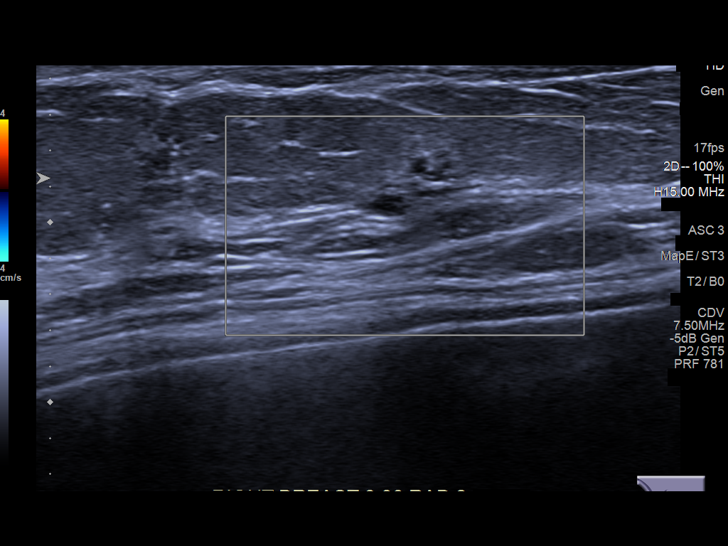
[im 5/17]
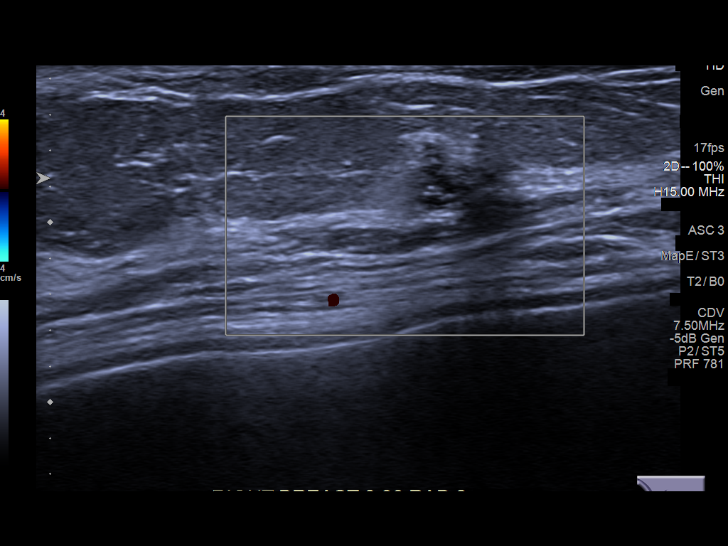
[im 6/17]
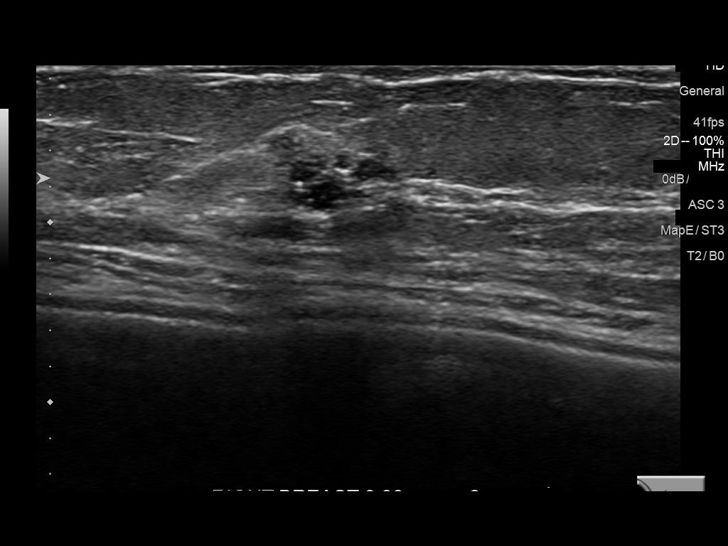
[im 8/17]
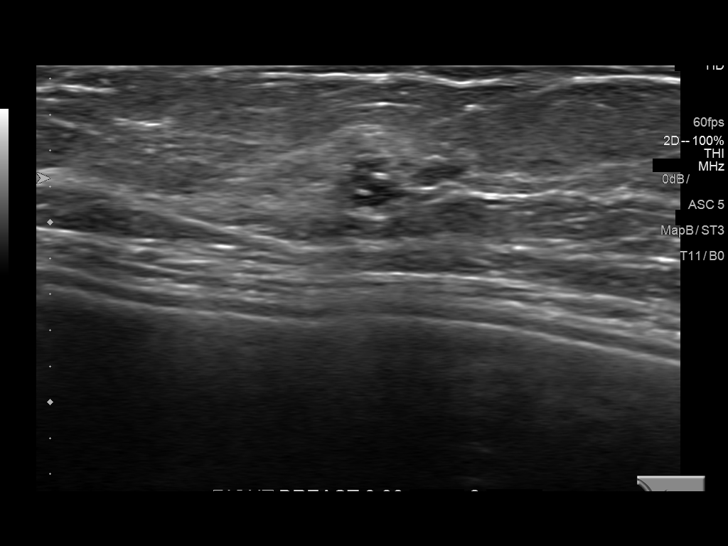
[im 9/17]
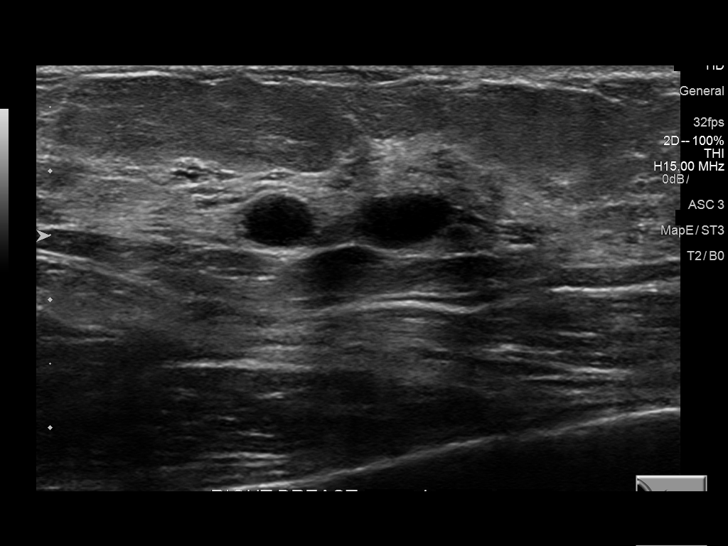
[im 10/17]
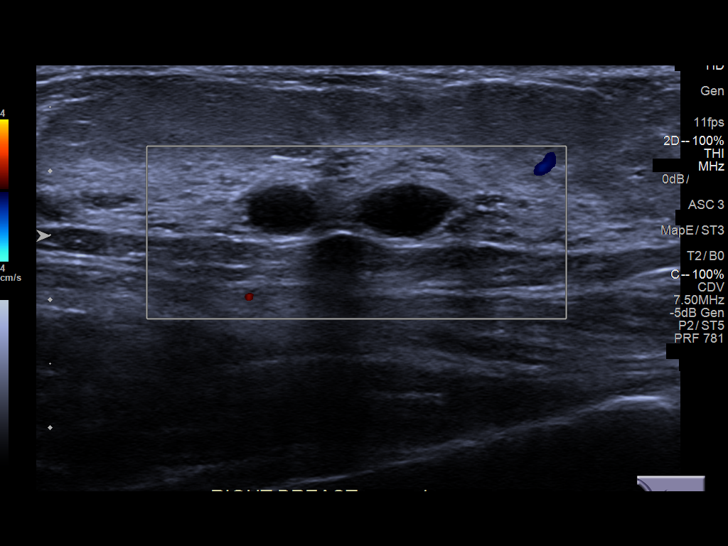
[im 12/17]
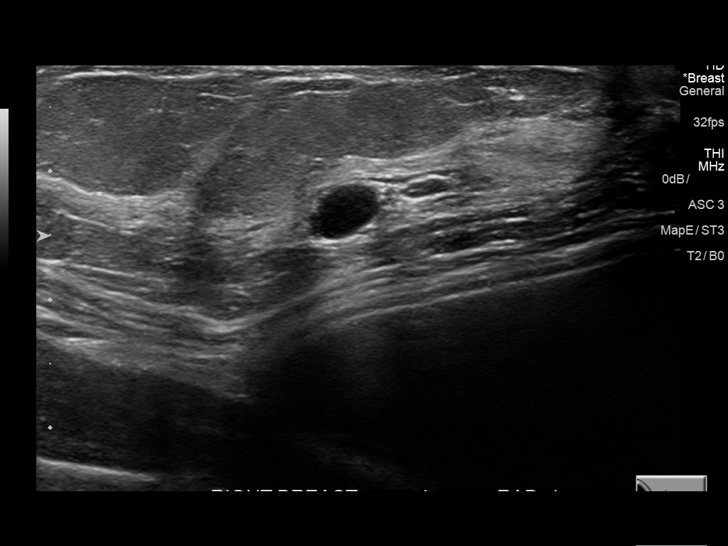
[im 13/17]
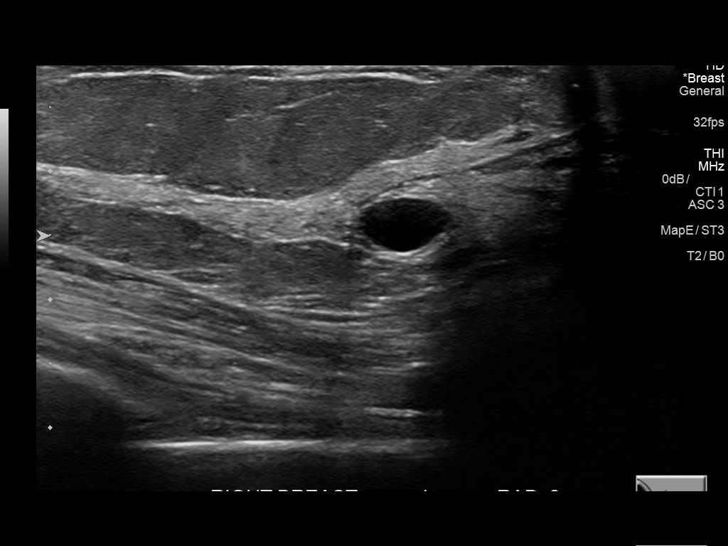
[im 14/17]
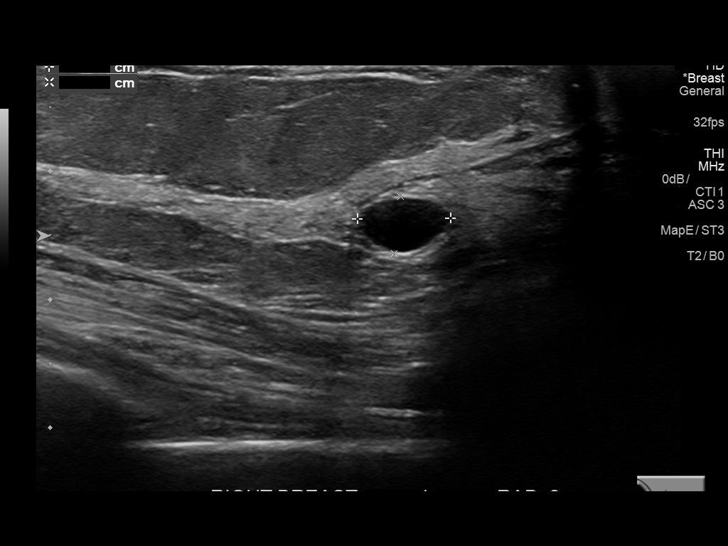
[im 16/17]
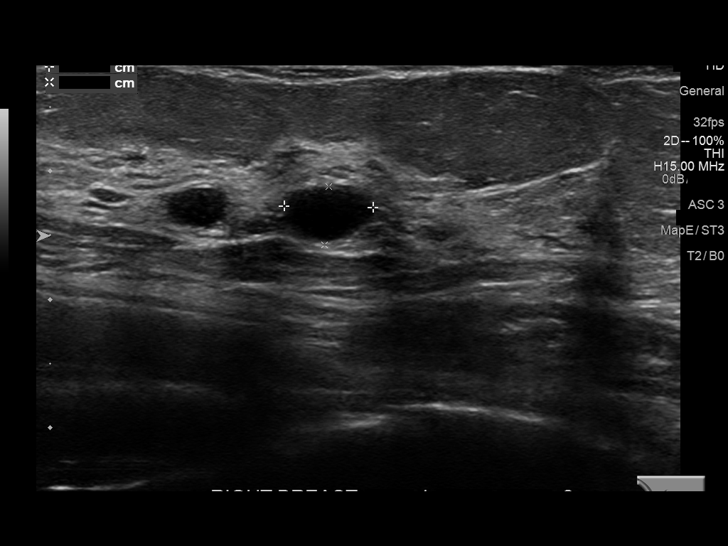
[im 17/17]
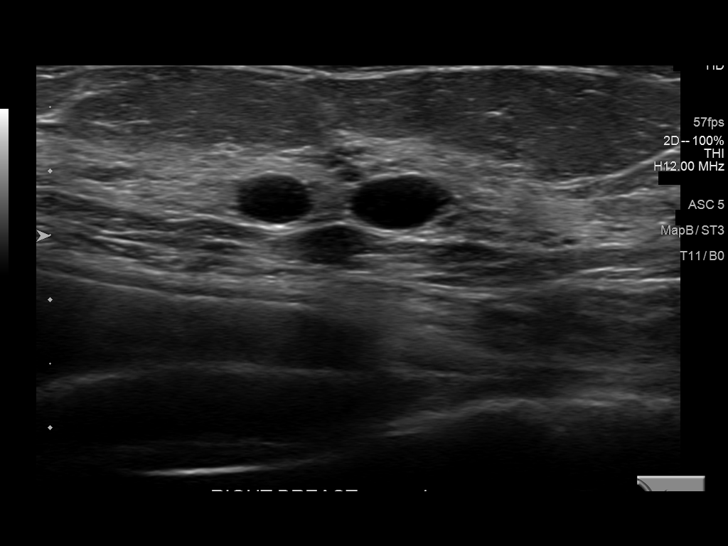

[13 of 17 positions shown; findings below may reference images not displayed]

ACR Breast Density Category b: There are scattered areas of
fibroglandular density.
FINDINGS: CC and MLO implant displaced views of the right breast were
performed with tomosynthesis. On the additional views, there is a
persistent asymmetry within the inferior left breast, corresponding
to the possible abnormality identified on screening mammogram. The
breast parenchymal pattern in this region appears similar to the
2836 exam. In addition, an asymmetry within the subareolar right
breast, middle depth appears similar to the 5787 and 6906 exams.

Mammographic images were processed with CAD.

On physical exam, no discrete mass is felt in the areas of concern
within the inferior and subareolar right breast.

Targeted ultrasound of the inferior and subareolar right breast was
performed demonstrating a probable area of fibrocystic change at 6
o'clock, 3 cm from the nipple measuring 7 x 4 x 5 mm, which is
thought to correspond to the finding seen on screening mammogram
within the inferior right breast. Similar-appearing smaller areas of
fibrocystic change are also noted scattered throughout the imaged
breast. Two simple cysts are incidentally noted at 12 o'clock, 1 cm
from the nipple measuring 7 and 6 mm. No suspicious cystic or solid
sonographic finding is seen in the areas of concern in the
subareolar or inferior right breast.
IMPRESSION: Probably benign right breast findings.

RECOMMENDATION:
Right diagnostic mammogram and ultrasound in 6 months.

I have discussed the findings and recommendations with the patient.
Results were also provided in writing at the conclusion of the
visit. If applicable, a reminder letter will be sent to the patient
regarding the next appointment.

BI-RADS CATEGORY  3: Probably benign.

## 2016-11-07 ENCOUNTER — Other Ambulatory Visit: Payer: Self-pay | Admitting: Internal Medicine

## 2016-11-07 ENCOUNTER — Ambulatory Visit
Admission: RE | Admit: 2016-11-07 | Discharge: 2016-11-07 | Disposition: A | Discharge status: Home / Self Care | Payer: BLUE CROSS/BLUE SHIELD | Source: Ambulatory Visit | Attending: Internal Medicine | Admitting: Internal Medicine

## 2016-11-07 DIAGNOSIS — N6011 Diffuse cystic mastopathy of right breast: Secondary | ICD-10-CM | POA: Diagnosis not present

## 2016-11-07 DIAGNOSIS — R928 Other abnormal and inconclusive findings on diagnostic imaging of breast: Secondary | ICD-10-CM

## 2016-12-02 ENCOUNTER — Other Ambulatory Visit: Payer: Self-pay | Admitting: Internal Medicine

## 2016-12-04 IMAGING — MG MM DIGITAL DIAGNOSTIC UNILAT*R* W/ TOMO W/ CAD
6 series · 6 of 14 positions shown · non-contrast
Comparison: Previous exam(s).

CLINICAL DATA: Callback from screening mammogram for possible right
breast mass

EXAM:
2D DIGITAL DIAGNOSTIC RIGHT MAMMOGRAM WITH CAD AND ADJUNCT TOMO
ULTRASOUND RIGHT BREAST

[R MLO]
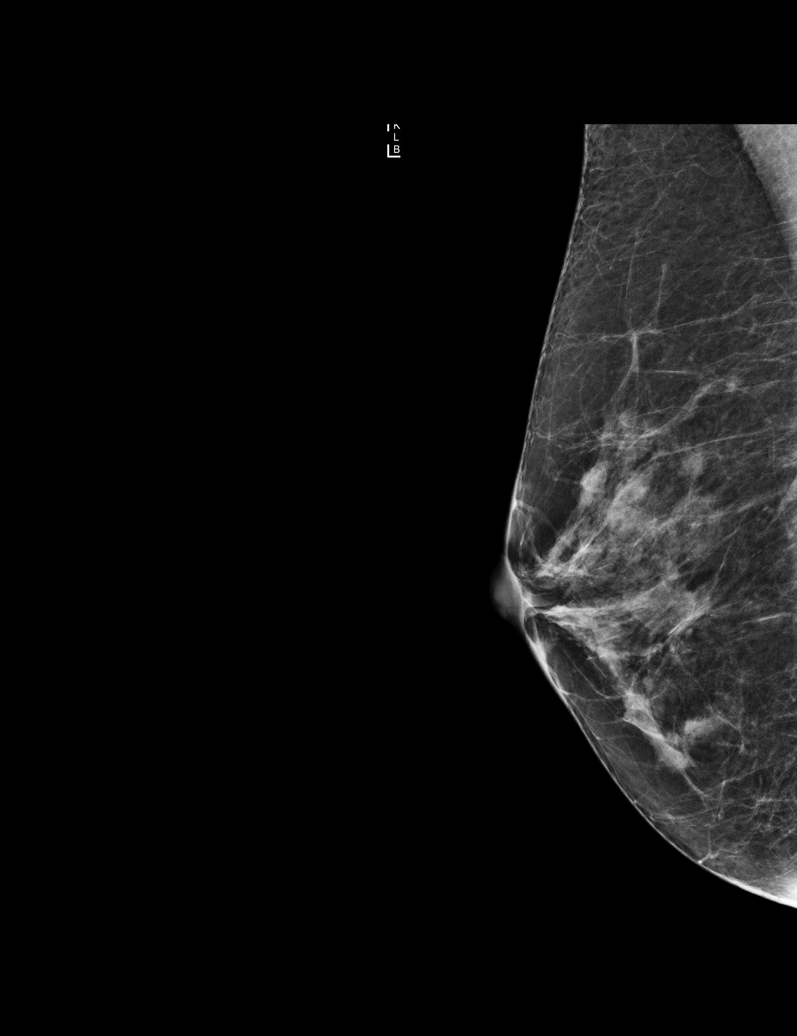

[R CC synth-2D]
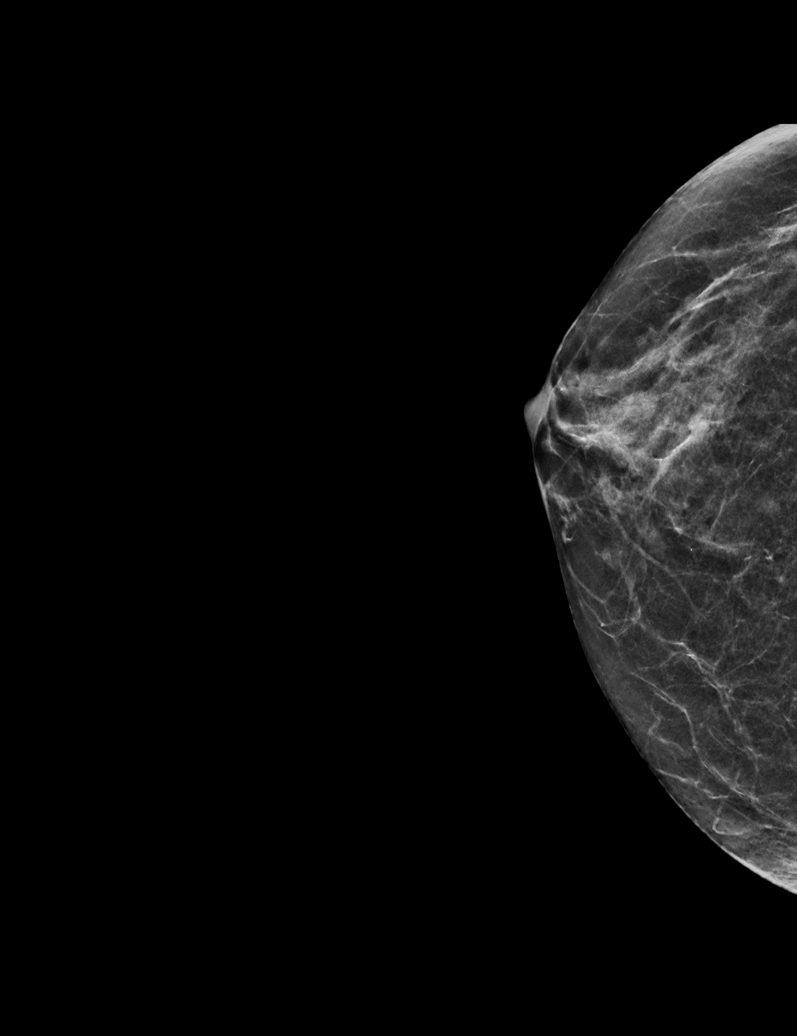

[R CC]
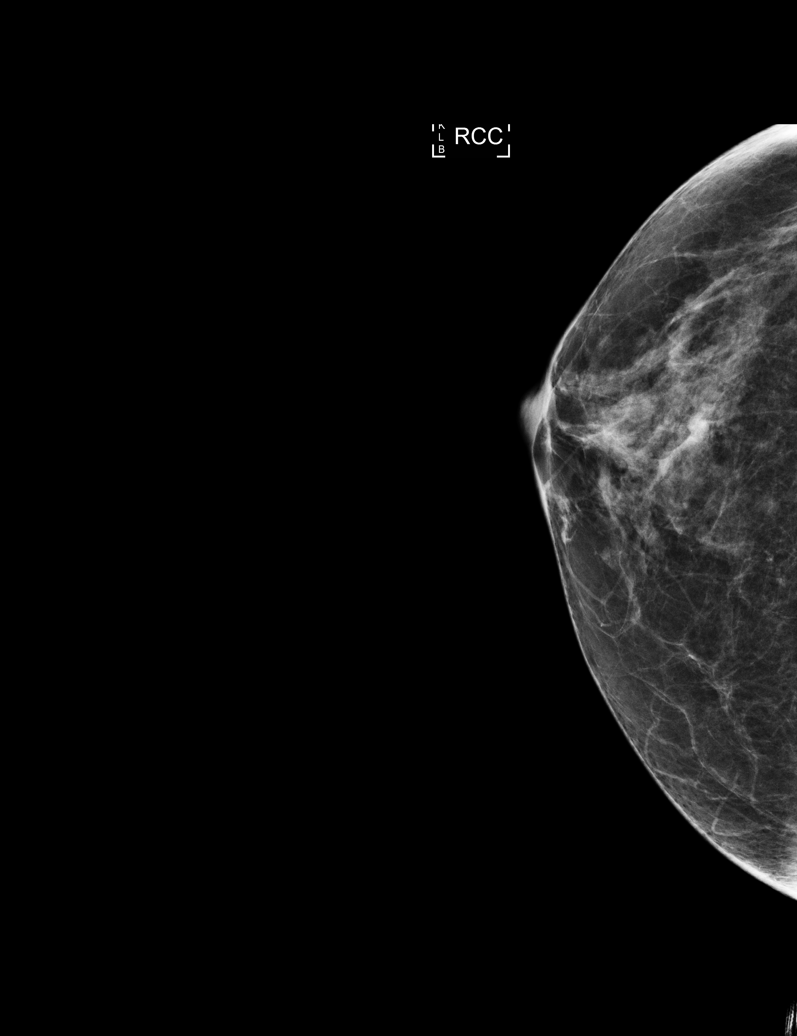

[R MLO synth-2D]
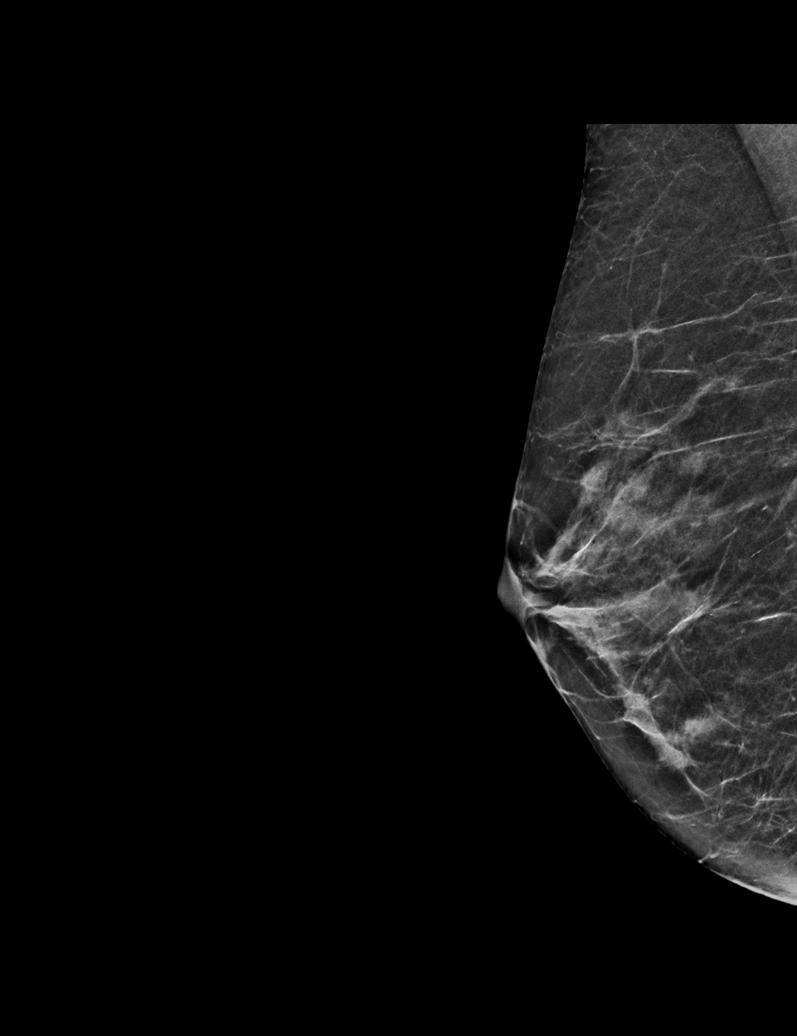

[R MLO tomo · tomo slice 21/42.0]
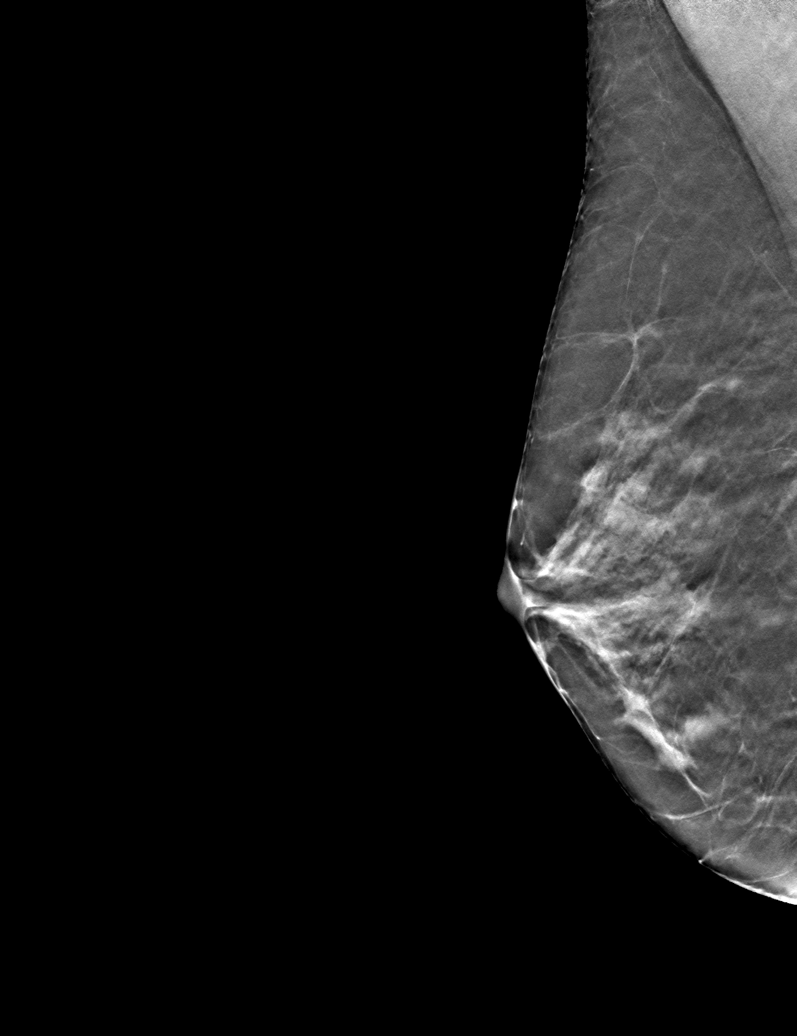

[R CC tomo · tomo slice 22/43.0]
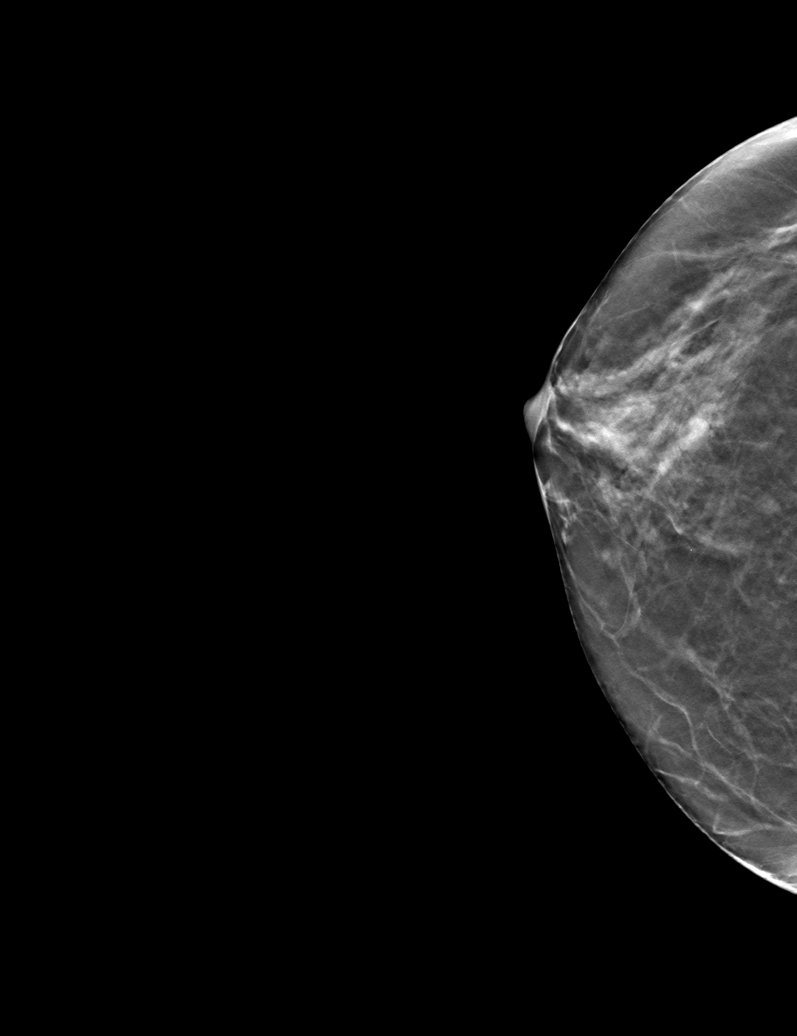

[6 of 14 positions shown; findings below may reference images not displayed]

ACR Breast Density Category b: There are scattered areas of
fibroglandular density.
FINDINGS: CC and MLO implant displaced views of the right breast were
performed with tomosynthesis. On the additional views, there is a
persistent asymmetry within the inferior left breast, corresponding
to the possible abnormality identified on screening mammogram. The
breast parenchymal pattern in this region appears similar to the
2836 exam. In addition, an asymmetry within the subareolar right
breast, middle depth appears similar to the 5787 and 6906 exams.

Mammographic images were processed with CAD.

On physical exam, no discrete mass is felt in the areas of concern
within the inferior and subareolar right breast.

Targeted ultrasound of the inferior and subareolar right breast was
performed demonstrating a probable area of fibrocystic change at 6
o'clock, 3 cm from the nipple measuring 7 x 4 x 5 mm, which is
thought to correspond to the finding seen on screening mammogram
within the inferior right breast. Similar-appearing smaller areas of
fibrocystic change are also noted scattered throughout the imaged
breast. Two simple cysts are incidentally noted at 12 o'clock, 1 cm
from the nipple measuring 7 and 6 mm. No suspicious cystic or solid
sonographic finding is seen in the areas of concern in the
subareolar or inferior right breast.
IMPRESSION: Probably benign right breast findings.

RECOMMENDATION:
Right diagnostic mammogram and ultrasound in 6 months.

I have discussed the findings and recommendations with the patient.
Results were also provided in writing at the conclusion of the
visit. If applicable, a reminder letter will be sent to the patient
regarding the next appointment.

BI-RADS CATEGORY  3: Probably benign.

## 2016-12-26 ENCOUNTER — Ambulatory Visit (INDEPENDENT_AMBULATORY_CARE_PROVIDER_SITE_OTHER): Payer: BLUE CROSS/BLUE SHIELD | Admitting: Internal Medicine

## 2016-12-26 ENCOUNTER — Other Ambulatory Visit (HOSPITAL_COMMUNITY)
Admission: RE | Admit: 2016-12-26 | Discharge: 2016-12-26 | Disposition: A | Discharge status: Home / Self Care | Payer: BLUE CROSS/BLUE SHIELD | Source: Ambulatory Visit | Attending: Internal Medicine | Admitting: Internal Medicine

## 2016-12-26 ENCOUNTER — Encounter: Payer: Self-pay | Admitting: Internal Medicine

## 2016-12-26 VITALS — BP 120/72 | HR 67 | Temp 98.4°F | Resp 12 | Ht 62.21 in | Wt 121.6 lb

## 2016-12-26 DIAGNOSIS — Z23 Encounter for immunization: Secondary | ICD-10-CM

## 2016-12-26 DIAGNOSIS — R42 Dizziness and giddiness: Secondary | ICD-10-CM

## 2016-12-26 DIAGNOSIS — M545 Low back pain, unspecified: Secondary | ICD-10-CM

## 2016-12-26 DIAGNOSIS — R109 Unspecified abdominal pain: Secondary | ICD-10-CM | POA: Insufficient documentation

## 2016-12-26 DIAGNOSIS — Z124 Encounter for screening for malignant neoplasm of cervix: Secondary | ICD-10-CM | POA: Insufficient documentation

## 2016-12-26 DIAGNOSIS — Z1211 Encounter for screening for malignant neoplasm of colon: Secondary | ICD-10-CM | POA: Diagnosis not present

## 2016-12-26 DIAGNOSIS — Z800 Family history of malignant neoplasm of digestive organs: Secondary | ICD-10-CM

## 2016-12-26 DIAGNOSIS — R140 Abdominal distension (gaseous): Secondary | ICD-10-CM

## 2016-12-26 DIAGNOSIS — Z0000 Encounter for general adult medical examination without abnormal findings: Secondary | ICD-10-CM | POA: Diagnosis not present

## 2016-12-26 DIAGNOSIS — E7800 Pure hypercholesterolemia, unspecified: Secondary | ICD-10-CM

## 2016-12-26 DIAGNOSIS — Z79899 Other long term (current) drug therapy: Secondary | ICD-10-CM

## 2016-12-26 LAB — CBC WITH DIFFERENTIAL/PLATELET
Basophils Absolute: 0 cells/uL (ref 0–200)
Basophils Relative: 0 %
EOS ABS: 66 {cells}/uL (ref 15–500)
Eosinophils Relative: 1 %
HEMATOCRIT: 38.3 % (ref 35.0–45.0)
HEMOGLOBIN: 12.6 g/dL (ref 11.7–15.5)
LYMPHS PCT: 30 %
Lymphs Abs: 1980 cells/uL (ref 850–3900)
MCH: 30.6 pg (ref 27.0–33.0)
MCHC: 32.9 g/dL (ref 32.0–36.0)
MCV: 93 fL (ref 80.0–100.0)
MPV: 9.9 fL (ref 7.5–12.5)
Monocytes Absolute: 792 cells/uL (ref 200–950)
Monocytes Relative: 12 %
NEUTROS PCT: 57 %
Neutro Abs: 3762 cells/uL (ref 1500–7800)
Platelets: 293 10*3/uL (ref 140–400)
RBC: 4.12 MIL/uL (ref 3.80–5.10)
RDW: 12.7 % (ref 11.0–15.0)
WBC: 6.6 10*3/uL (ref 3.8–10.8)

## 2016-12-26 NOTE — Progress Notes (Addendum)
Patient ID: Lisa Chen, female   DOB: 13-Apr-1956, 61 y.o.   MRN: [id number]   Subjective:    Patient ID: Lisa Chen, female    DOB: [birth year], 61 y.o.   MRN: [id number]  HPI  Patient here for her physical exam.  She reports she is doing relatively well.  She established care with me in 09/2016.  At that time was having some lower back discomfort.  See note.  She reports still having some lower back discomfort, but recently developed some lower abdominal/pelvic discomfort.  Some bloating.  Bowels are moving. No diarrhea.  She is eating.  No vomiting.  No chest pain.  Breathing stable. Also reports noticing feeling sick and had dizziness.  Noticed dizziness in the dentist chair.  Movements aggravated.  This has resolved now.  Discussed further w/up and evaluation.  She declines.  Wants to monitor.     Past Medical History:  Diagnosis Date  . Headache    3/WEEK, / DUE TO EYELIDS  . Osteopenia    NOT PROGRESSED IN LAST 10 YEARS  . Ptosis, both eyelids    UPPER   Past Surgical History:  Procedure Laterality Date  . ABDOMINAL HYSTERECTOMY  1995  . AUGMENTATION MAMMAPLASTY Bilateral 2000   SALINE  . BREAST EXCISIONAL BIOPSY Right 1990's   NEG  . BREAST SURGERY Right    BIOPSY  . BROW LIFT Bilateral 04/03/2015   Procedure: BLEPHAROPLASTY;  Surgeon: Karle Starch, MD;  Location: [place];  Service: Ophthalmology;  Laterality: Bilateral;  . [place] SURGERY  2001  . COSMETIC SURGERY     SKIN CANCER-MELANOMA ON BACK REMOVED-BASAL CELL ON CHEST ALSO  . EYE SURGERY Bilateral 2000 AND 2002   CATARACTS  . FOOT SURGERY    . HERNIA REPAIR  1984  . PTOSIS REPAIR Bilateral 04/03/2015   Procedure: PTOSIS REPAIR;  Surgeon: Karle Starch, MD;  Location: [place];  Service: Ophthalmology;  Laterality: Bilateral;  . TONSILLECTOMY     Family History  Problem Relation Age of Onset  . Arthritis Mother   . Heart disease Mother   . Diabetes Mother   . Heart disease  Maternal Grandmother   . Heart disease Maternal Grandfather   . Diabetes Maternal Grandfather   . Breast cancer Neg Hx    Social History   Social History  . Marital status: Married    Spouse name: N/A  . Number of children: N/A  . Years of education: N/A   Social History Main Topics  . Smoking status: Never Smoker  . Smokeless tobacco: Never Used  . Alcohol use 1.8 oz/week    3 Glasses of wine per week     Comment: OCCAS  . Drug use: No  . Sexual activity: Not Asked   Other Topics Concern  . None   Social History Narrative  . None    Outpatient Encounter Prescriptions as of 12/26/2016  Medication Sig  . MINIVELLE 0.0375 MG/24HR APPLY 1 PATCH TWICE WEEKLY FOR 30 DAYS   No facility-administered encounter medications on file as of 12/26/2016.     Review of Systems  Constitutional: Positive for fatigue. Negative for appetite change and unexpected weight change.  HENT: Negative for congestion and sinus pressure.   Eyes: Negative for pain and visual disturbance.  Respiratory: Negative for cough, chest tightness and shortness of breath.   Cardiovascular: Negative for chest pain, palpitations and leg swelling.  Gastrointestinal: Positive for abdominal pain. Negative for diarrhea, nausea  and vomiting.  Genitourinary: Negative for difficulty urinating and dysuria.  Musculoskeletal: Positive for back pain. Negative for joint swelling.  Skin: Negative for color change and rash.  Neurological: Positive for dizziness. Negative for headaches.  Hematological: Negative for adenopathy. Does not bruise/bleed easily.  Psychiatric/Behavioral: Negative for agitation and dysphoric mood.       Objective:    Physical Exam  Constitutional: She is oriented to person, place, and time. She appears well-developed and well-nourished. No distress.  HENT:  Nose: Nose normal.  Mouth/Throat: Oropharynx is clear and moist.  Eyes: Right eye exhibits no discharge. Left eye exhibits no discharge.  No scleral icterus.  Neck: Neck supple. No thyromegaly present.  Cardiovascular: Normal rate and regular rhythm.   Pulmonary/Chest: Breath sounds normal. No accessory muscle usage. No tachypnea. No respiratory distress. She has no decreased breath sounds. She has no wheezes. She has no rhonchi. Right breast exhibits no inverted nipple, no mass, no nipple discharge and no tenderness (no axillary adenopathy). Left breast exhibits no inverted nipple, no mass, no nipple discharge and no tenderness (no axilarry adenopathy).  Abdominal: Soft. Bowel sounds are normal.  Minimal tenderness to palpation over the lower abdomen/pelvic region - suprapubic region.  No rebound or guarding present.   Genitourinary:  Genitourinary Comments: Normal external genitalia.  Vaginal vault without lesions.  S/p hysterectomy.   Pap smear performed.  Could not appreciate any adnexal masses or tenderness.    Musculoskeletal: She exhibits no edema or tenderness.  Lymphadenopathy:    She has no cervical adenopathy.  Neurological: She is alert and oriented to person, place, and time.  Skin: Skin is warm. No rash noted. No erythema.  Psychiatric: She has a normal mood and affect. Her behavior is normal.    BP 120/72 (BP Location: Left Arm, Patient Position: Sitting, Cuff Size: Normal)   Pulse 67   Temp 98.4 F (36.9 C) (Oral)   Resp 12   Ht 5' 2.21" (1.58 m)   Wt 121 lb 9.6 oz (55.2 kg)   SpO2 98%   BMI 22.09 kg/m  Wt Readings from Last 3 Encounters:  12/26/16 121 lb 9.6 oz (55.2 kg)  10/09/16 121 lb 6.4 oz (55.1 kg)  04/03/15 122 lb (55.3 kg)     Lab Results  Component Value Date   WBC 6.6 12/26/2016   HGB 12.6 12/26/2016   HCT 38.3 12/26/2016   PLT 293 12/26/2016   GLUCOSE 85 12/26/2016   CHOL 226 (H) 10/09/2016   TRIG 66.0 10/09/2016   HDL 74.60 10/09/2016   LDLCALC 138 (H) 10/09/2016   ALT 17 12/26/2016   AST 21 12/26/2016   NA 139 12/26/2016   K 4.0 12/26/2016   CL 102 12/26/2016   CREATININE  0.75 12/26/2016   BUN 17 12/26/2016   CO2 22 12/26/2016   TSH 1.74 10/09/2016    US Breast Ltd Uni Right Inc Axilla  Result Date: 11/07/2016 CLINICAL DATA:  Annual examination of both breasts and follow-up of probably benign fibrocystic changes in the 6 o'clock position of the right breast. The patient is asymptomatic EXAM: 2D DIGITAL DIAGNOSTIC BILATERAL MAMMOGRAM WITH IMPLANTS, CAD AND ADJUNCT TOMO ULTRASOUND RIGHT BREAST The patient has retropectoral implants. Standard and implant displaced views were performed. COMPARISON:  08/16/2015, 07/31/2015, 07/20/2014, 06/22/2013 ACR Breast Density Category c: The breast tissue is heterogeneously dense, which may obscure small masses. FINDINGS: No mass, architectural distortion, or suspicious microcalcification is identified in either breast to suggest malignancy. The possible mass identified in  the lower central right breast on the screening mammogram of 07/31/2015 is less apparent on today's mammogram. Targeted ultrasound is performed, showing a cluster of cysts at 6 o'clock position 3 cm from the nipple measuring 0.6 x 0.3 x 0.5 cm. There is no associated color Doppler flow. This area of probable fibrocystic change appears smaller than it did on the prior ultrasound of April 2017, at which time it measured 0.7 x 0.4 x 0.5 cm. Mammographic images were processed with CAD. IMPRESSION: No evidence of malignancy in either breast. Small cluster of cysts/fibrocystic change in the 6 o'clock region of the right breast measures slightly smaller on today's ultrasound and is less apparent on the mammogram. RECOMMENDATION: Screening mammogram in one year.(Code:SM-B-01Y) I have discussed the findings and recommendations with the patient. Results were also provided in writing at the conclusion of the visit. If applicable, a reminder letter will be sent to the patient regarding the next appointment. BI-RADS CATEGORY  2: Benign. Electronically Signed   By: Curlene Dolphin M.D.    On: 11/07/2016 12:06   Mm Diag Breast W/implant Bilateral  Result Date: 11/07/2016 CLINICAL DATA:  Annual examination of both breasts and follow-up of probably benign fibrocystic changes in the 6 o'clock position of the right breast. The patient is asymptomatic EXAM: 2D DIGITAL DIAGNOSTIC BILATERAL MAMMOGRAM WITH IMPLANTS, CAD AND ADJUNCT TOMO ULTRASOUND RIGHT BREAST The patient has retropectoral implants. Standard and implant displaced views were performed. COMPARISON:  08/16/2015, 07/31/2015, 07/20/2014, 06/22/2013 ACR Breast Density Category c: The breast tissue is heterogeneously dense, which may obscure small masses. FINDINGS: No mass, architectural distortion, or suspicious microcalcification is identified in either breast to suggest malignancy. The possible mass identified in the lower central right breast on the screening mammogram of 07/31/2015 is less apparent on today's mammogram. Targeted ultrasound is performed, showing a cluster of cysts at 6 o'clock position 3 cm from the nipple measuring 0.6 x 0.3 x 0.5 cm. There is no associated color Doppler flow. This area of probable fibrocystic change appears smaller than it did on the prior ultrasound of April 2017, at which time it measured 0.7 x 0.4 x 0.5 cm. Mammographic images were processed with CAD. IMPRESSION: No evidence of malignancy in either breast. Small cluster of cysts/fibrocystic change in the 6 o'clock region of the right breast measures slightly smaller on today's ultrasound and is less apparent on the mammogram. RECOMMENDATION: Screening mammogram in one year.(Code:SM-B-01Y) I have discussed the findings and recommendations with the patient. Results were also provided in writing at the conclusion of the visit. If applicable, a reminder letter will be sent to the patient regarding the next appointment. BI-RADS CATEGORY  2: Benign. Electronically Signed   By: Curlene Dolphin M.D.   On: 11/07/2016 12:06       Assessment & Plan:   Problem  List Items Addressed This Visit    Abdominal pain    Abdominal pain and bloating as outlined.  Obtain labs and CT scan.        Relevant Orders   CT Abdomen Pelvis W Contrast   CBC w/Diff (Completed)   Basic Metabolic Panel (BMET) (Completed)   Hepatic function panel (Completed)   Urinalysis, Routine w reflex microscopic (Completed)   Ambulatory referral to General Surgery   Current use of estrogen therapy    She has tried to stop previously.  Did not do well when stopped.  Wants to remain on estrogen for now.       Dizziness  Better now.  Desires no further testing or evaluation.  Will follow.        Family history of colon cancer    Due colonoscopy per pt.  Refer to Dr Bary Castilla.        Health care maintenance    Physical today 12/26/16.  PAP 12/26/16.  Mammogram 11/07/16 - [facility] II.  States she is probably due colonoscopy.  Thinks she had around age 2 or 38.  Was performed by Dr Bary Castilla.        Hypercholesterolemia    Low cholesterol diet and exercise.  Follow lipid panel.        Low back pain    Xray revealed no acute bony abnormality.  With low back pain and now lower abdominal pain, pelvic pain and bloating, will obtain CT abdomen and pelvis.  Also check urine, liver panel, metabolic panel and cbc.         Other Visit Diagnoses    Routine general medical examination at a health care facility    -  Primary   Bloating       Relevant Orders   CT Abdomen Pelvis W Contrast   Ambulatory referral to General Surgery   Pap smear for cervical cancer screening       Relevant Orders   Cytology - PAP   Need for immunization against influenza       Relevant Orders   Flu Vaccine QUAD 36+ mos IM (Completed)   Colon cancer screening       Relevant Orders   Ambulatory referral to General Surgery       Einar Pheasant, MD

## 2016-12-26 NOTE — Assessment & Plan Note (Addendum)
Physical today 12/26/16.  PAP 12/26/16.  Mammogram 11/07/16 - [facility] II.  States she is probably due colonoscopy.  Thinks she had around age 61 or 42.  Was performed by Dr Bary Castilla.

## 2016-12-27 LAB — URINALYSIS, ROUTINE W REFLEX MICROSCOPIC
Bilirubin Urine: NEGATIVE
Glucose, UA: NEGATIVE
HGB URINE DIPSTICK: NEGATIVE
LEUKOCYTES UA: NEGATIVE
Nitrite: NEGATIVE
PROTEIN: NEGATIVE
Specific Gravity, Urine: 1.023 (ref 1.001–1.035)
pH: 6 (ref 5.0–8.0)

## 2016-12-27 LAB — HEPATIC FUNCTION PANEL
ALBUMIN: 4.4 g/dL (ref 3.6–5.1)
ALT: 17 U/L (ref 6–29)
AST: 21 U/L (ref 10–35)
Alkaline Phosphatase: 53 U/L (ref 33–130)
BILIRUBIN INDIRECT: 0.8 mg/dL (ref 0.2–1.2)
Bilirubin, Direct: 0.1 mg/dL (ref <=0.2)
TOTAL PROTEIN: 7 g/dL (ref 6.1–8.1)
Total Bilirubin: 0.9 mg/dL (ref 0.2–1.2)

## 2016-12-27 LAB — BASIC METABOLIC PANEL
BUN: 17 mg/dL (ref 7–25)
CALCIUM: 9.1 mg/dL (ref 8.6–10.4)
CHLORIDE: 102 mmol/L (ref 98–110)
CO2: 22 mmol/L (ref 20–32)
CREATININE: 0.75 mg/dL (ref 0.50–0.99)
GLUCOSE: 85 mg/dL (ref 65–99)
Potassium: 4 mmol/L (ref 3.5–5.3)
Sodium: 139 mmol/L (ref 135–146)

## 2016-12-29 ENCOUNTER — Encounter: Payer: Self-pay | Admitting: Internal Medicine

## 2016-12-29 DIAGNOSIS — R42 Dizziness and giddiness: Secondary | ICD-10-CM | POA: Insufficient documentation

## 2016-12-29 NOTE — Assessment & Plan Note (Signed)
Xray revealed no acute bony abnormality.  With low back pain and now lower abdominal pain, pelvic pain and bloating, will obtain CT abdomen and pelvis.  Also check urine, liver panel, metabolic panel and cbc.

## 2016-12-29 NOTE — Assessment & Plan Note (Signed)
Due colonoscopy per pt.  Refer to Dr Bary Castilla.

## 2016-12-29 NOTE — Assessment & Plan Note (Signed)
Low cholesterol diet and exercise.  Follow lipid panel.   

## 2016-12-29 NOTE — Assessment & Plan Note (Signed)
Better now.  Desires no further testing or evaluation.  Will follow.

## 2016-12-29 NOTE — Assessment & Plan Note (Signed)
Abdominal pain and bloating as outlined.  Obtain labs and CT scan.

## 2016-12-29 NOTE — Assessment & Plan Note (Signed)
She has tried to stop previously.  Did not do well when stopped.  Wants to remain on estrogen for now.

## 2016-12-30 ENCOUNTER — Encounter: Payer: Self-pay | Admitting: General Surgery

## 2016-12-30 ENCOUNTER — Other Ambulatory Visit: Payer: Self-pay | Admitting: Internal Medicine

## 2016-12-30 ENCOUNTER — Ambulatory Visit
Admission: RE | Admit: 2016-12-30 | Discharge: 2016-12-30 | Disposition: A | Discharge status: Home / Self Care | Payer: BLUE CROSS/BLUE SHIELD | Source: Ambulatory Visit | Attending: Internal Medicine | Admitting: Internal Medicine

## 2016-12-30 DIAGNOSIS — R140 Abdominal distension (gaseous): Secondary | ICD-10-CM | POA: Diagnosis not present

## 2016-12-30 DIAGNOSIS — R1031 Right lower quadrant pain: Secondary | ICD-10-CM | POA: Diagnosis not present

## 2016-12-30 DIAGNOSIS — R109 Unspecified abdominal pain: Secondary | ICD-10-CM | POA: Insufficient documentation

## 2016-12-30 HISTORY — DX: Malignant (primary) neoplasm, unspecified: C80.1

## 2016-12-30 MED ORDER — IOPAMIDOL (ISOVUE-300) INJECTION 61%
100.0000 mL | Freq: Once | INTRAVENOUS | Status: AC | PRN
  Administered 2016-12-30: 100 mL via INTRAVENOUS

## 2017-01-01 LAB — CYTOLOGY - PAP
Adequacy: ABSENT
DIAGNOSIS: NEGATIVE
HPV (WINDOPATH): NOT DETECTED

## 2017-01-02 ENCOUNTER — Encounter: Payer: Self-pay | Admitting: Internal Medicine

## 2017-01-08 DIAGNOSIS — Z85828 Personal history of other malignant neoplasm of skin: Secondary | ICD-10-CM | POA: Diagnosis not present

## 2017-01-14 ENCOUNTER — Ambulatory Visit: Payer: Self-pay | Admitting: General Surgery

## 2017-01-27 ENCOUNTER — Other Ambulatory Visit: Payer: Self-pay | Admitting: Internal Medicine

## 2017-02-11 ENCOUNTER — Encounter: Payer: Self-pay | Admitting: General Surgery

## 2017-02-11 ENCOUNTER — Ambulatory Visit (INDEPENDENT_AMBULATORY_CARE_PROVIDER_SITE_OTHER): Payer: BLUE CROSS/BLUE SHIELD | Admitting: General Surgery

## 2017-02-11 VITALS — BP 108/68 | HR 74 | Resp 12 | Ht 62.0 in | Wt 128.0 lb

## 2017-02-11 DIAGNOSIS — Z1211 Encounter for screening for malignant neoplasm of colon: Secondary | ICD-10-CM

## 2017-02-11 DIAGNOSIS — R1031 Right lower quadrant pain: Secondary | ICD-10-CM | POA: Diagnosis not present

## 2017-02-11 MED ORDER — POLYETHYLENE GLYCOL 3350 17 GM/SCOOP PO POWD: ORAL | 0 refills | Status: DC

## 2017-02-11 NOTE — Patient Instructions (Signed)
Colonoscopy, Adult A colonoscopy is an exam to look at the entire large intestine. During the exam, a lubricated, bendable tube is inserted into the anus and then passed into the rectum, colon, and other parts of the large intestine. A colonoscopy is often done as a part of normal colorectal screening or in response to certain symptoms, such as anemia, persistent diarrhea, abdominal pain, and blood in the stool. The exam can help screen for and diagnose medical problems, including:  Tumors.  Polyps.  Inflammation.  Areas of bleeding.  Tell a health care provider about:  Any allergies you have.  All medicines you are taking, including vitamins, herbs, eye drops, creams, and over-the-counter medicines.  Any problems you or family members have had with anesthetic medicines.  Any blood disorders you have.  Any surgeries you have had.  Any medical conditions you have.  Any problems you have had passing stool. What are the risks? Generally, this is a safe procedure. However, problems may occur, including:  Bleeding.  A tear in the intestine.  A reaction to medicines given during the exam.  Infection (rare).  What happens before the procedure? Eating and drinking restrictions Follow instructions from your health care provider about eating and drinking, which may include:  A few days before the procedure - follow a low-fiber diet. Avoid nuts, seeds, dried fruit, raw fruits, and vegetables.  1-3 days before the procedure - follow a clear liquid diet. Drink only clear liquids, such as clear broth or bouillon, black coffee or tea, clear juice, clear soft drinks or sports drinks, gelatin dessert, and popsicles. Avoid any liquids that contain red or purple dye.  On the day of the procedure - do not eat or drink anything during the 2 hours before the procedure, or within the time period that your health care provider recommends.  Bowel prep If you were prescribed an oral bowel prep  to clean out your colon:  Take it as told by your health care provider. Starting the day before your procedure, you will need to drink a large amount of medicated liquid. The liquid will cause you to have multiple loose stools until your stool is almost clear or light green.  If your skin or anus gets irritated from diarrhea, you may use these to relieve the irritation: ? Medicated wipes, such as adult wet wipes with aloe and vitamin E. ? A skin soothing-product like petroleum jelly.  If you vomit while drinking the bowel prep, take a break for up to 60 minutes and then begin the bowel prep again. If vomiting continues and you cannot take the bowel prep without vomiting, call your health care provider.  General instructions  Ask your health care provider about changing or stopping your regular medicines. This is especially important if you are taking diabetes medicines or blood thinners.  Plan to have someone take you home from the hospital or clinic. What happens during the procedure?  An IV tube may be inserted into one of your veins.  You will be given medicine to help you relax (sedative).  To reduce your risk of infection: ? Your health care team will wash or sanitize their hands. ? Your anal area will be washed with soap.  You will be asked to lie on your side with your knees bent.  Your health care provider will lubricate a long, thin, flexible tube. The tube will have a camera and a light on the end.  The tube will be inserted into your   anus.  The tube will be gently eased through your rectum and colon.  Air will be delivered into your colon to keep it open. You may feel some pressure or cramping.  The camera will be used to take images during the procedure.  A small tissue sample may be removed from your body to be examined under a microscope (biopsy). If any potential problems are found, the tissue will be sent to a lab for testing.  If small polyps are found, your  health care provider may remove them and have them checked for cancer cells.  The tube that was inserted into your anus will be slowly removed. The procedure may vary among health care providers and hospitals. What happens after the procedure?  Your blood pressure, heart rate, breathing rate, and blood oxygen level will be monitored until the medicines you were given have worn off.  Do not drive for 24 hours after the exam.  You may have a small amount of blood in your stool.  You may pass gas and have mild abdominal cramping or bloating due to the air that was used to inflate your colon during the exam.  It is up to you to get the results of your procedure. Ask your health care provider, or the department performing the procedure, when your results will be ready. This information is not intended to replace advice given to you by your health care provider. Make sure you discuss any questions you have with your health care provider. Document Released: 04/11/2000 Document Revised: 02/13/2016 Document Reviewed: 06/26/2015 Elsevier Interactive Patient Education  2018 Elsevier Inc.  

## 2017-02-11 NOTE — Progress Notes (Signed)
Patient ID: Lisa Chen, female   DOB: 07/14/1955, 61 y.o.   MRN: [id number]  Chief Complaint  Patient presents with  . Colonoscopy    HPI Lisa Chen is a 61 y.o. female.  Patient is here today to discuss Colonoscopy. Patient's last colonoscopy was 2008. Bowels move everyday without difficulty. Denies blood in stools. CT of abdomen and pelvis was done on 12/30/16 due to right lower back pain radiating to right lower quadrant of abdomen since June. Lumbar x-rays completed 10/09/16. Occasional tender to touch. Sitting makes pain worst. Walking and standing does not make worse. Denies muscle spasms. Pain in back has now resolved. HPI  Past Medical History:  Diagnosis Date  . Cancer ([place])    melanoma  . Headache    3/WEEK, / DUE TO EYELIDS  . Osteopenia    NOT PROGRESSED IN LAST 10 YEARS  . Ptosis, both eyelids    UPPER    Past Surgical History:  Procedure Laterality Date  . ABDOMINAL HYSTERECTOMY  1995  . AUGMENTATION MAMMAPLASTY Bilateral 2000   SALINE  . BREAST EXCISIONAL BIOPSY Right 1990's   NEG  . BREAST SURGERY Right    BIOPSY  . BROW LIFT Bilateral 04/03/2015   Procedure: BLEPHAROPLASTY;  Surgeon: Karle Starch, MD;  Location: [place];  Service: Ophthalmology;  Laterality: Bilateral;  . [place] SURGERY  2001  . COSMETIC SURGERY     SKIN CANCER-MELANOMA ON BACK REMOVED-BASAL CELL ON CHEST ALSO  . EYE SURGERY Bilateral 2000 AND 2002   CATARACTS  . FOOT SURGERY    . HERNIA REPAIR  1984  . PTOSIS REPAIR Bilateral 04/03/2015   Procedure: PTOSIS REPAIR;  Surgeon: Karle Starch, MD;  Location: [place];  Service: Ophthalmology;  Laterality: Bilateral;  . TONSILLECTOMY      Family History  Problem Relation Age of Onset  . Arthritis Mother   . Heart disease Mother   . Diabetes Mother   . Heart disease Maternal Grandmother   . Heart disease Maternal Grandfather   . Diabetes Maternal Grandfather   . Colon cancer Cousin   . Colon cancer  Maternal Uncle   . Breast cancer Neg Hx     Social History Social History  Substance Use Topics  . Smoking status: Never Smoker  . Smokeless tobacco: Never Used  . Alcohol use 1.8 oz/week    3 Glasses of wine per week     Comment: OCCAS    Allergies  Allergen Reactions  . Erythromycin Other (See Comments)    SEVERE GASTRITIS  . Other     VICRYL SUTURES CAUSED INFLAMMATION    Current Outpatient Prescriptions  Medication Sig Dispense Refill  . MINIVELLE 0.0375 MG/24HR APPLY 1 PATCH TWICE WEEKLY FOR 30 DAYS AS DIRECTED 8 patch 0  . polyethylene glycol powder (GLYCOLAX/MIRALAX) powder 255 grams one bottle for colonoscopy prep 255 g 0   No current facility-administered medications for this visit.     Review of Systems Review of Systems  Constitutional: Negative.   Respiratory: Negative.   Cardiovascular: Negative.   Gastrointestinal: Negative for constipation and diarrhea.  Neurological: Positive for headaches.    Blood pressure 108/68, pulse 74, resp. rate 12, height 5\' 2"  (1.575 m), weight 128 lb (58.1 kg).  Physical Exam Physical Exam  Constitutional: She is oriented to person, place, and time. She appears well-developed and well-nourished.  HENT:  Mouth/Throat: Oropharynx is clear and moist.  Eyes: Conjunctivae are normal. No scleral icterus.  Neck: Neck supple.  Cardiovascular: Normal rate, regular rhythm and normal heart sounds.   Pulmonary/Chest: Effort normal and breath sounds normal.  Abdominal: Soft. Normal appearance and bowel sounds are normal. There is no tenderness.  Lymphadenopathy:    She has no cervical adenopathy.  Neurological: She is alert and oriented to person, place, and time.  Skin: Skin is warm and dry.  Psychiatric: Her behavior is normal.    Data Reviewed CT of the abdomen and pelvis dated 12/30/2016 was reviewed independently. No pathologic process identified.  Laboratory studies dated 02/25/2017 reviewed. Normal hepatic function  panel. Normal electrolytes, creatinine 0.75, normal CBC with a hemoglobin of 12.6 with an MCV of 93 and white blood cell count of 6600. Platelet count 293,000.  Pathology from her 10/21/1994 removal of the right tube and ovary and appendix was notable for endometriosis of the ovary and evidence of acute appendicitis.   Assessment    Abdominal pain without clear etiology on clinical exam or CT.  Candidate for colon screening.    Plan    Options for management reviewed. Previous colonoscopy in May 2008 was normal. The patient's family necessarily place her at high risk precluding non-colonoscopy screening options.  Discussed Cologuard as an option.   Colonoscopy with possible biopsy/polypectomy prn: Information regarding the procedure, including its potential risks and complications (including but not limited to perforation of the bowel, which may require emergency surgery to repair, and bleeding) was verbally given to the patient. Educational information regarding lower intestinal endoscopy was given to the patient. Written instructions for how to complete the bowel prep using Miralax were provided. The importance of drinking ample fluids to avoid dehydration as a result of the prep emphasized.  HPI, Physical Exam, Assessment and Plan have been scribed under the direction and in the presence of Hervey Ard, MD.  Reece Packer, RN  I have completed the exam and reviewed the above documentation for accuracy and completeness.  I agree with the above.  Haematologist has been used and any errors in dictation or transcription are unintentional.  Hervey Ard, M.D., F.A.C.S.   Robert Bellow 02/11/2017, 9:20 PM   Patient has been scheduled for a colonoscopy on 04-01-17 at [facility]. Miralax prescription has been sent in to the patient's pharmacy today. Colonoscopy instructions have been reviewed with the patient. This patient is aware to call the office if they have further  questions.   Dominga Ferry, CMA

## 2017-02-25 ENCOUNTER — Encounter: Payer: Self-pay | Admitting: General Surgery

## 2017-02-27 ENCOUNTER — Ambulatory Visit: Payer: BLUE CROSS/BLUE SHIELD | Admitting: Internal Medicine

## 2017-03-10 ENCOUNTER — Telehealth: Payer: Self-pay

## 2017-03-10 NOTE — Telephone Encounter (Signed)
Patient called and needed to reschedule her colonoscopy for 04/01/17. She would like to get it done next year and her insurance will remain the same.   The patient is scheduled for a Colonoscopy at [facility] on 06/24/17. They are aware to call the day before to get their arrival time. She has already picked up her Miralax prescription. The patient is aware of date and instructions. Endoscopy notified of date change.

## 2017-05-04 DIAGNOSIS — H60339 Swimmer's ear, unspecified ear: Secondary | ICD-10-CM | POA: Diagnosis not present

## 2017-05-20 ENCOUNTER — Encounter: Payer: Self-pay | Admitting: Internal Medicine

## 2017-05-20 ENCOUNTER — Ambulatory Visit: Payer: BLUE CROSS/BLUE SHIELD | Admitting: Internal Medicine

## 2017-05-20 VITALS — BP 124/60 | HR 75 | Temp 98.0°F | Ht 62.0 in | Wt 126.8 lb

## 2017-05-20 DIAGNOSIS — Z79899 Other long term (current) drug therapy: Secondary | ICD-10-CM | POA: Diagnosis not present

## 2017-05-20 DIAGNOSIS — E7800 Pure hypercholesterolemia, unspecified: Secondary | ICD-10-CM | POA: Diagnosis not present

## 2017-05-20 DIAGNOSIS — R109 Unspecified abdominal pain: Secondary | ICD-10-CM

## 2017-05-20 DIAGNOSIS — H60331 Swimmer's ear, right ear: Secondary | ICD-10-CM | POA: Diagnosis not present

## 2017-05-20 LAB — BASIC METABOLIC PANEL
BUN: 22 mg/dL (ref 6–23)
CHLORIDE: 104 meq/L (ref 96–112)
CO2: 23 meq/L (ref 19–32)
Calcium: 9.4 mg/dL (ref 8.4–10.5)
Creatinine, Ser: 0.68 mg/dL (ref 0.40–1.20)
GFR: 92.89 mL/min (ref >60.00)
Glucose, Bld: 92 mg/dL (ref 70–99)
POTASSIUM: 4.7 meq/L (ref 3.5–5.1)
SODIUM: 139 meq/L (ref 135–145)

## 2017-05-20 LAB — LIPID PANEL
CHOL/HDL RATIO: 3
Cholesterol: 227 mg/dL — ABNORMAL HIGH (ref 0–200)
HDL: 80 mg/dL (ref >39.00)
LDL CALC: 134 mg/dL — AB (ref 0–99)
NONHDL: 146.72
Triglycerides: 65 mg/dL (ref 0.0–149.0)
VLDL: 13 mg/dL (ref 0.0–40.0)

## 2017-05-20 LAB — HEPATIC FUNCTION PANEL
ALBUMIN: 4.2 g/dL (ref 3.5–5.2)
ALK PHOS: 54 U/L (ref 39–117)
ALT: 19 U/L (ref 0–35)
AST: 26 U/L (ref 0–37)
Bilirubin, Direct: 0.1 mg/dL (ref 0.0–0.3)
Total Bilirubin: 1 mg/dL (ref 0.2–1.2)
Total Protein: 7.4 g/dL (ref 6.0–8.3)

## 2017-05-20 MED ORDER — ESTRADIOL 0.0375 MG/24HR TD PTTW: MEDICATED_PATCH | TRANSDERMAL | 1 refills | Status: DC

## 2017-05-20 NOTE — Progress Notes (Signed)
Pre visit review using our clinic review tool, if applicable. No additional management support is needed unless otherwise documented below in the visit note. 

## 2017-05-20 NOTE — Progress Notes (Signed)
Patient ID: Lisa Chen, female   DOB: 1955/09/19, 62 y.o.   MRN: [id number]   Subjective:    Patient ID: Lisa Chen, female    DOB: 1955-05-03, 62 y.o.   MRN: [id number]  HPI  Patient here for a scheduled follow up.  She was recently evaluated by Dr Kathyrn Sheriff for right ear swelling and infection.  Also reported right side of face was swollen. States diagnosed with swimmer's ear.  Placed on oral abx and drops.  She is better.  Still has some issues with the ear.  States if she lies on her ear, notices aching.  Also reports persistent itching in her right and now left ear.  Persistent symptoms, but better.  No increased sinus congestion. No chest congestion or increased cough.  No chest pain or sob.  No acid reflux.  No abdominal pain now.  Bowels moving.  Planning for colonoscopy in 05/2017.    Past Medical History:  Diagnosis Date  . Cancer ([place])    melanoma  . Headache    3/WEEK, / DUE TO EYELIDS  . Osteopenia    NOT PROGRESSED IN LAST 10 YEARS  . Ptosis, both eyelids    UPPER   Past Surgical History:  Procedure Laterality Date  . ABDOMINAL HYSTERECTOMY  1995  . AUGMENTATION MAMMAPLASTY Bilateral 2000   SALINE  . BREAST EXCISIONAL BIOPSY Right 1990's   NEG  . BREAST SURGERY Right    BIOPSY  . BROW LIFT Bilateral 04/03/2015   Procedure: BLEPHAROPLASTY;  Surgeon: Karle Starch, MD;  Location: [place];  Service: Ophthalmology;  Laterality: Bilateral;  . [place] SURGERY  2001  . COSMETIC SURGERY     SKIN CANCER-MELANOMA ON BACK REMOVED-BASAL CELL ON CHEST ALSO  . EYE SURGERY Bilateral 2000 AND 2002   CATARACTS  . FOOT SURGERY    . HERNIA REPAIR  1984  . PTOSIS REPAIR Bilateral 04/03/2015   Procedure: PTOSIS REPAIR;  Surgeon: Karle Starch, MD;  Location: [facility];  Service: Ophthalmology;  Laterality: Bilateral;  . TONSILLECTOMY     Family History  Problem Relation Age of Onset  . Arthritis Mother   . Heart disease Mother   . Diabetes Mother    . Heart disease Maternal Grandmother   . Heart disease Maternal Grandfather   . Diabetes Maternal Grandfather   . Colon cancer Cousin   . Colon cancer Maternal Uncle   . Breast cancer Neg Hx    Social History   Socioeconomic History  . Marital status: Married    Spouse name: None  . Number of children: None  . Years of education: None  . Highest education level: None  Social Needs  . Financial resource strain: None  . Food insecurity - worry: None  . Food insecurity - inability: None  . Transportation needs - medical: None  . Transportation needs - non-medical: None  Occupational History  . None  Tobacco Use  . Smoking status: Never Smoker  . Smokeless tobacco: Never Used  Substance and Sexual Activity  . Alcohol use: Yes    Alcohol/week: 1.8 oz    Types: 3 Glasses of wine per week    Comment: OCCAS  . Drug use: No  . Sexual activity: None  Other Topics Concern  . None  Social History Narrative  . None    Outpatient Encounter Medications as of 05/20/2017  Medication Sig  . estradiol (MINIVELLE) 0.0375 MG/24HR APPLY 1 PATCH TWICE WEEKLY FOR 30 DAYS  AS DIRECTED  . [DISCONTINUED] MINIVELLE 0.0375 MG/24HR APPLY 1 PATCH TWICE WEEKLY FOR 30 DAYS AS DIRECTED  . [DISCONTINUED] polyethylene glycol powder (GLYCOLAX/MIRALAX) powder 255 grams one bottle for colonoscopy prep   No facility-administered encounter medications on file as of 05/20/2017.     Review of Systems  Constitutional: Negative for appetite change and unexpected weight change.  HENT: Negative for congestion and sinus pressure.        Ear swelling as outlined.  Improved. Still with some issues, including itching.    Respiratory: Negative for cough, chest tightness and shortness of breath.   Cardiovascular: Negative for chest pain, palpitations and leg swelling.  Gastrointestinal: Negative for abdominal pain, diarrhea and nausea.  Genitourinary: Negative for difficulty urinating and dysuria.    Musculoskeletal: Negative for joint swelling and myalgias.  Skin: Negative for color change and rash.  Neurological: Negative for dizziness, light-headedness and headaches.  Psychiatric/Behavioral: Negative for agitation and dysphoric mood.       Objective:    Physical Exam  Constitutional: She appears well-developed. No distress.  HENT:  Nose: Nose normal.  Mouth/Throat: Oropharynx is clear and moist.  Left ear - clear.  Right ear - no increased erythema.    Eyes: Conjunctivae are normal. Right eye exhibits no discharge. Left eye exhibits no discharge.  Neck: Neck supple. No thyromegaly present.  Cardiovascular: Normal rate and regular rhythm.  Pulmonary/Chest: Breath sounds normal. No respiratory distress. She has no wheezes.  Abdominal: Soft. Bowel sounds are normal. There is no tenderness.  Musculoskeletal: She exhibits no edema or tenderness.  Lymphadenopathy:    She has no cervical adenopathy.  Skin: No rash noted. No erythema.  Psychiatric: She has a normal mood and affect. Her behavior is normal.    BP 124/60   Pulse 75   Temp 98 F (36.7 C) (Oral)   Ht 5\' 2"  (1.575 m)   Wt 126 lb 12.8 oz (57.5 kg)   SpO2 98%   BMI 23.19 kg/m  Wt Readings from Last 3 Encounters:  05/20/17 126 lb 12.8 oz (57.5 kg)  02/11/17 128 lb (58.1 kg)  12/26/16 121 lb 9.6 oz (55.2 kg)     Lab Results  Component Value Date   WBC 6.6 12/26/2016   HGB 12.6 12/26/2016   HCT 38.3 12/26/2016   PLT 293 12/26/2016   GLUCOSE 92 05/20/2017   CHOL 227 (H) 05/20/2017   TRIG 65.0 05/20/2017   HDL 80.00 05/20/2017   LDLCALC 134 (H) 05/20/2017   ALT 19 05/20/2017   AST 26 05/20/2017   NA 139 05/20/2017   K 4.7 05/20/2017   CL 104 05/20/2017   CREATININE 0.68 05/20/2017   BUN 22 05/20/2017   CO2 23 05/20/2017   TSH 1.74 10/09/2016       Assessment & Plan:   Problem List Items Addressed This Visit    Abdominal pain    Appears to be doing better.  Has colonoscopy planned in 05/2017.         Current use of estrogen therapy    Discussed with her today.  Wants to remain on estrogen.        Hypercholesterolemia - Primary    Low cholesterol diet and exercise.  Follow lipid panel.        Relevant Orders   Lipid panel (Completed)   Hepatic function panel (Completed)   Basic metabolic panel (Completed)   Swimmer's ear    Increased pain and swelling as outlined.  Saw ENT.  Treated  with drops and oral abx.  Better.  Decreased swelling.  Still some itching and some discomfort if lies on her ear.  Extend out drops.  Have ENT reevaluate.        Relevant Orders   Ambulatory referral to ENT       Einar Pheasant, MD

## 2017-05-21 ENCOUNTER — Encounter: Payer: Self-pay | Admitting: Internal Medicine

## 2017-05-23 ENCOUNTER — Encounter: Payer: Self-pay | Admitting: Internal Medicine

## 2017-05-23 DIAGNOSIS — H60339 Swimmer's ear, unspecified ear: Secondary | ICD-10-CM | POA: Insufficient documentation

## 2017-05-23 NOTE — Assessment & Plan Note (Signed)
Increased pain and swelling as outlined.  Saw ENT.  Treated with drops and oral abx.  Better.  Decreased swelling.  Still some itching and some discomfort if lies on her ear.  Extend out drops.  Have ENT reevaluate.

## 2017-05-23 NOTE — Assessment & Plan Note (Signed)
Discussed with her today.  Wants to remain on estrogen.

## 2017-05-23 NOTE — Assessment & Plan Note (Signed)
Appears to be doing better.  Has colonoscopy planned in 05/2017.

## 2017-05-23 NOTE — Assessment & Plan Note (Signed)
Low cholesterol diet and exercise.  Follow lipid panel.   

## 2017-06-15 ENCOUNTER — Encounter: Payer: Self-pay | Admitting: *Deleted

## 2017-06-24 ENCOUNTER — Ambulatory Visit
Admission: RE | Admit: 2017-06-24 | Discharge: 2017-06-24 | Disposition: A | Discharge status: Home / Self Care | Payer: BLUE CROSS/BLUE SHIELD | Source: Ambulatory Visit | Attending: General Surgery | Admitting: General Surgery

## 2017-06-24 ENCOUNTER — Ambulatory Visit: Payer: BLUE CROSS/BLUE SHIELD | Admitting: Anesthesiology

## 2017-06-24 ENCOUNTER — Encounter
Admission: RE | Disposition: A | Discharge status: Home / Self Care | Payer: Self-pay | Source: Ambulatory Visit | Attending: General Surgery

## 2017-06-24 DIAGNOSIS — Z85820 Personal history of malignant melanoma of skin: Secondary | ICD-10-CM | POA: Insufficient documentation

## 2017-06-24 DIAGNOSIS — Z91048 Other nonmedicinal substance allergy status: Secondary | ICD-10-CM | POA: Insufficient documentation

## 2017-06-24 DIAGNOSIS — K5730 Diverticulosis of large intestine without perforation or abscess without bleeding: Secondary | ICD-10-CM | POA: Diagnosis not present

## 2017-06-24 DIAGNOSIS — M8580 Other specified disorders of bone density and structure, unspecified site: Secondary | ICD-10-CM | POA: Diagnosis not present

## 2017-06-24 DIAGNOSIS — Z79899 Other long term (current) drug therapy: Secondary | ICD-10-CM | POA: Diagnosis not present

## 2017-06-24 DIAGNOSIS — Z1211 Encounter for screening for malignant neoplasm of colon: Secondary | ICD-10-CM | POA: Diagnosis not present

## 2017-06-24 DIAGNOSIS — Z881 Allergy status to other antibiotic agents status: Secondary | ICD-10-CM | POA: Insufficient documentation

## 2017-06-24 DIAGNOSIS — K5790 Diverticulosis of intestine, part unspecified, without perforation or abscess without bleeding: Secondary | ICD-10-CM | POA: Diagnosis not present

## 2017-06-24 HISTORY — PX: COLONOSCOPY WITH PROPOFOL: SHX5780

## 2017-06-24 SURGERY — COLONOSCOPY WITH PROPOFOL
Anesthesia: General

## 2017-06-24 MED ORDER — PROPOFOL 10 MG/ML IV BOLUS
INTRAVENOUS | Status: DC | PRN
  Administered 2017-06-24: 40 mg via INTRAVENOUS
  Administered 2017-06-24: 20 mg via INTRAVENOUS

## 2017-06-24 MED ORDER — PROPOFOL 500 MG/50ML IV EMUL
INTRAVENOUS | Status: DC | PRN
  Administered 2017-06-24: 100 ug/kg/min via INTRAVENOUS

## 2017-06-24 MED ORDER — SODIUM CHLORIDE 0.9 % IV SOLN
INTRAVENOUS | Status: DC
  Administered 2017-06-24 (×2): via INTRAVENOUS

## 2017-06-24 MED ORDER — PROPOFOL 500 MG/50ML IV EMUL
INTRAVENOUS | Status: AC
  Filled 2017-06-24: qty 50

## 2017-06-24 NOTE — H&P (Signed)
IVANA NICASTRO [id number] Apr 29, 1955     HPI: Healthy 62 year old woman for screening colonoscopy.  She reports she tolerated the prep well.   Medications Prior to Admission  Medication Sig Dispense Refill Last Dose  . estradiol (MINIVELLE) 0.0375 MG/24HR APPLY 1 PATCH TWICE WEEKLY FOR 30 DAYS AS DIRECTED 8 patch 1 06/22/2017   Allergies  Allergen Reactions  . Erythromycin Other (See Comments)    SEVERE GASTRITIS  . Other     VICRYL SUTURES CAUSED INFLAMMATION   Past Medical History:  Diagnosis Date  . Cancer ([place])    melanoma  . Headache    3/WEEK, / DUE TO EYELIDS  . Osteopenia    NOT PROGRESSED IN LAST 10 YEARS  . Ptosis, both eyelids    UPPER   Past Surgical History:  Procedure Laterality Date  . ABDOMINAL HYSTERECTOMY  1995  . AUGMENTATION MAMMAPLASTY Bilateral 2000   SALINE  . BREAST EXCISIONAL BIOPSY Right 1990's   NEG  . BREAST SURGERY Right    BIOPSY  . BROW LIFT Bilateral 04/03/2015   Procedure: BLEPHAROPLASTY;  Surgeon: Karle Starch, MD;  Location: [place] [facility];  Service: Ophthalmology;  Laterality: Bilateral;  . [place] SURGERY  2001  . COSMETIC SURGERY     SKIN CANCER-MELANOMA ON BACK REMOVED-BASAL CELL ON CHEST ALSO  . EYE SURGERY Bilateral 2000 AND 2002   CATARACTS  . FOOT SURGERY    . HERNIA REPAIR  1984  . PTOSIS REPAIR Bilateral 04/03/2015   Procedure: PTOSIS REPAIR;  Surgeon: Karle Starch, MD;  Location: [facility];  Service: Ophthalmology;  Laterality: Bilateral;  . TONSILLECTOMY     Social History   Socioeconomic History  . Marital status: Married    Spouse name: Not on file  . Number of children: Not on file  . Years of education: Not on file  . Highest education level: Not on file  Social Needs  . Financial resource strain: Not on file  . Food insecurity - worry: Not on file  . Food insecurity - inability: Not on file  . Transportation needs - medical: Not on file  . Transportation needs - non-medical: Not on  file  Occupational History  . Not on file  Tobacco Use  . Smoking status: Never Smoker  . Smokeless tobacco: Never Used  Substance and Sexual Activity  . Alcohol use: Yes    Alcohol/week: 1.8 oz    Types: 3 Glasses of wine per week    Comment: OCCAS. none last 24hrs  . Drug use: No  . Sexual activity: Not on file  Other Topics Concern  . Not on file  Social History Narrative  . Not on file   Social History   Social History Narrative  . Not on file     ROS: Negative.     PE: HEENT: Negative. Lungs: Clear. Cardio: RR.  Assessment/Plan:  Proceed with planned endoscopy.  Forest Gleason [facility] 06/24/2017

## 2017-06-24 NOTE — Anesthesia Preprocedure Evaluation (Signed)
Anesthesia Evaluation  Patient identified by MRN, date of birth, ID band Patient awake    Reviewed: Allergy & Precautions, NPO status , Patient's Chart, lab work & pertinent test results  History of Anesthesia Complications Negative for: history of anesthetic complications  Airway Mallampati: II  TM Distance: >3 FB Neck ROM: Full    Dental no notable dental hx.    Pulmonary neg pulmonary ROS, neg sleep apnea, neg COPD,    breath sounds clear to auscultation- rhonchi (-) wheezing      Cardiovascular Exercise Tolerance: Good (-) hypertension(-) CAD and (-) Past MI  Rhythm:Regular Rate:Normal - Systolic murmurs and - Diastolic murmurs    Neuro/Psych  Headaches, negative psych ROS   GI/Hepatic negative GI ROS, Neg liver ROS,   Endo/Other  negative endocrine ROSneg diabetes  Renal/GU negative Renal ROS     Musculoskeletal negative musculoskeletal ROS (+)   Abdominal (+) - obese,   Peds  Hematology negative hematology ROS (+)   Anesthesia Other Findings Past Medical History: No date: Cancer ([place])     Comment:  melanoma No date: Headache     Comment:  3/WEEK, / DUE TO EYELIDS No date: Osteopenia     Comment:  NOT PROGRESSED IN LAST 10 YEARS No date: Ptosis, both eyelids     Comment:  UPPER   Reproductive/Obstetrics                             Anesthesia Physical Anesthesia Plan  ASA: II  Anesthesia Plan: General   Post-op Pain Management:    Induction: Intravenous  PONV Risk Score and Plan: 2 and Propofol infusion  Airway Management Planned: Natural Airway  Additional Equipment:   Intra-op Plan:   Post-operative Plan:   Informed Consent: I have reviewed the patients History and Physical, chart, labs and discussed the procedure including the risks, benefits and alternatives for the proposed anesthesia with the patient or authorized representative who has indicated his/her  understanding and acceptance.   Dental advisory given  Plan Discussed with: CRNA and Anesthesiologist  Anesthesia Plan Comments:         Anesthesia Quick Evaluation

## 2017-06-24 NOTE — Anesthesia Post-op Follow-up Note (Signed)
Anesthesia QCDR form completed.        

## 2017-06-24 NOTE — Anesthesia Postprocedure Evaluation (Signed)
Anesthesia Post Note  Patient: Lisa Chen  Procedure(s) Performed: COLONOSCOPY WITH PROPOFOL (N/A )  Patient location during evaluation: Endoscopy Anesthesia Type: General Level of consciousness: awake and alert and oriented Pain management: pain level controlled Vital Signs Assessment: post-procedure vital signs reviewed and stable Respiratory status: spontaneous breathing, nonlabored ventilation and respiratory function stable Cardiovascular status: blood pressure returned to baseline and stable Postop Assessment: no signs of nausea or vomiting Anesthetic complications: no     Last Vitals:  Vitals:   06/24/17 0909 06/24/17 0919  BP: 103/70 112/68  Pulse: 72 78  Resp: 14 16  Temp:    SpO2: 100%     Last Pain:  Vitals:   06/24/17 0849  TempSrc: Tympanic                 Jarrah Babich

## 2017-06-24 NOTE — Transfer of Care (Signed)
Immediate Anesthesia Transfer of Care Note  Patient: Lisa Chen  Procedure(s) Performed: COLONOSCOPY WITH PROPOFOL (N/A )  Patient Location: PACU  Anesthesia Type:General  Level of Consciousness: awake, alert  and oriented  Airway & Oxygen Therapy: Patient Spontanous Breathing  Post-op Assessment: Report given to RN and Post -op Vital signs reviewed and stable  Post vital signs: Reviewed and stable  Last Vitals:  Vitals:   06/24/17 0801 06/24/17 0849  BP: 111/73 (!) 85/33  Pulse: 83 87  Resp: 16 16  Temp: (!) 36.3 C 36.4 C  SpO2: 100% 100%    Last Pain:  Vitals:   06/24/17 0849  TempSrc: Tympanic         Complications: No apparent anesthesia complications

## 2017-06-24 NOTE — Op Note (Signed)
[facility] Gastroenterology Patient Name: Lisa Chen Procedure Date: 06/24/2017 8:23 AM MRN: [id number] Account #: [id number] Date of Birth: May 14, 1955 Admit Type: Outpatient Age: 62 Room: [facility] ENDO ROOM 1 Gender: Female Note Status: Finalized Procedure:            Colonoscopy Indications:          Screening for colorectal malignant neoplasm Providers:            Robert Bellow, MD Referring MD:         Einar Pheasant, MD (Referring MD) Medicines:            Monitored Anesthesia Care Complications:        No immediate complications. Procedure:            Pre-Anesthesia Assessment:                       - Prior to the procedure, a History and Physical was                        performed, and patient medications, allergies and                        sensitivities were reviewed. The patient's tolerance of                        previous anesthesia was reviewed.                       - The risks and benefits of the procedure and the                        sedation options and risks were discussed with the                        patient. All questions were answered and informed                        consent was obtained.                       After obtaining informed consent, the colonoscope was                        passed under direct vision. Throughout the procedure,                        the patient's blood pressure, pulse, and oxygen                        saturations were monitored continuously. The                        Colonoscope was introduced through the anus and                        advanced to the the cecum, identified by appendiceal                        orifice and ileocecal valve. The colonoscopy was  performed without difficulty. The patient tolerated the                        procedure well. The quality of the bowel preparation                        was excellent. Findings:      A few small-mouthed  diverticula were found in the sigmoid colon.      The retroflexed view of the distal rectum and anal verge was normal and       showed no anal or rectal abnormalities. Impression:           - Diverticulosis in the sigmoid colon.                       - The distal rectum and anal verge are normal on                        retroflexion view.                       - No specimens collected. Recommendation:       - Repeat colonoscopy in 10 years for screening purposes. Procedure Code(s):    --- Professional ---                       [phone], Colonoscopy, flexible; diagnostic, including                        collection of specimen(s) by brushing or washing, when                        performed (separate procedure) Diagnosis Code(s):    --- Professional ---                       Z12.11, Encounter for screening for malignant neoplasm                        of colon                       K57.30, Diverticulosis of large intestine without                        perforation or abscess without bleeding CPT copyright 2016 [employer]. All rights reserved. The codes documented in this report are preliminary and upon coder review may  be revised to meet current compliance requirements. Robert Bellow, MD 06/24/2017 8:45:34 AM This report has been signed electronically. Number of Addenda: 0 Note Initiated On: 06/24/2017 8:23 AM Scope Withdrawal Time: 0 hours 6 minutes 59 seconds  Total Procedure Duration: 0 hours 14 minutes 20 seconds       [facility]

## 2017-06-25 ENCOUNTER — Encounter: Payer: Self-pay | Admitting: General Surgery

## 2017-07-09 ENCOUNTER — Encounter: Payer: Self-pay | Admitting: General Surgery

## 2017-07-09 DIAGNOSIS — Z85828 Personal history of other malignant neoplasm of skin: Secondary | ICD-10-CM | POA: Diagnosis not present

## 2017-08-11 ENCOUNTER — Other Ambulatory Visit: Payer: Self-pay | Admitting: Internal Medicine

## 2017-08-20 DIAGNOSIS — L574 Cutis laxa senilis: Secondary | ICD-10-CM | POA: Diagnosis not present

## 2017-11-24 ENCOUNTER — Other Ambulatory Visit: Payer: Self-pay | Admitting: Internal Medicine

## 2017-12-24 DIAGNOSIS — L574 Cutis laxa senilis: Secondary | ICD-10-CM | POA: Diagnosis not present

## 2017-12-30 ENCOUNTER — Encounter: Payer: Self-pay | Admitting: Internal Medicine

## 2017-12-30 ENCOUNTER — Ambulatory Visit (INDEPENDENT_AMBULATORY_CARE_PROVIDER_SITE_OTHER): Payer: BLUE CROSS/BLUE SHIELD | Admitting: Internal Medicine

## 2017-12-30 VITALS — BP 106/62 | HR 80 | Temp 97.9°F | Resp 18 | Ht 62.0 in | Wt 122.2 lb

## 2017-12-30 DIAGNOSIS — Z1231 Encounter for screening mammogram for malignant neoplasm of breast: Secondary | ICD-10-CM

## 2017-12-30 DIAGNOSIS — Z23 Encounter for immunization: Secondary | ICD-10-CM

## 2017-12-30 DIAGNOSIS — Z79818 Long term (current) use of other agents affecting estrogen receptors and estrogen levels: Secondary | ICD-10-CM

## 2017-12-30 DIAGNOSIS — Z0000 Encounter for general adult medical examination without abnormal findings: Secondary | ICD-10-CM

## 2017-12-30 DIAGNOSIS — Z13220 Encounter for screening for lipoid disorders: Secondary | ICD-10-CM | POA: Diagnosis not present

## 2017-12-30 DIAGNOSIS — Z1239 Encounter for other screening for malignant neoplasm of breast: Secondary | ICD-10-CM

## 2017-12-30 DIAGNOSIS — R42 Dizziness and giddiness: Secondary | ICD-10-CM

## 2017-12-30 DIAGNOSIS — Z79899 Other long term (current) drug therapy: Secondary | ICD-10-CM | POA: Diagnosis not present

## 2017-12-30 DIAGNOSIS — E7800 Pure hypercholesterolemia, unspecified: Secondary | ICD-10-CM | POA: Diagnosis not present

## 2017-12-30 LAB — HEPATIC FUNCTION PANEL
ALT: 15 U/L (ref 0–35)
AST: 20 U/L (ref 0–37)
Albumin: 4.6 g/dL (ref 3.5–5.2)
Alkaline Phosphatase: 50 U/L (ref 39–117)
BILIRUBIN DIRECT: 0.1 mg/dL (ref 0.0–0.3)
BILIRUBIN TOTAL: 0.9 mg/dL (ref 0.2–1.2)
Total Protein: 7.7 g/dL (ref 6.0–8.3)

## 2017-12-30 LAB — CBC WITH DIFFERENTIAL/PLATELET
BASOS ABS: 0 10*3/uL (ref 0.0–0.1)
Basophils Relative: 0.7 % (ref 0.0–3.0)
EOS ABS: 0.1 10*3/uL (ref 0.0–0.7)
Eosinophils Relative: 1.5 % (ref 0.0–5.0)
HCT: 39.7 % (ref 36.0–46.0)
HEMOGLOBIN: 13.3 g/dL (ref 12.0–15.0)
LYMPHS PCT: 29.7 % (ref 12.0–46.0)
Lymphs Abs: 1.7 10*3/uL (ref 0.7–4.0)
MCHC: 33.6 g/dL (ref 30.0–36.0)
MCV: 93.4 fl (ref 78.0–100.0)
MONO ABS: 0.7 10*3/uL (ref 0.1–1.0)
Monocytes Relative: 12.5 % — ABNORMAL HIGH (ref 3.0–12.0)
Neutro Abs: 3.1 10*3/uL (ref 1.4–7.7)
Neutrophils Relative %: 55.6 % (ref 43.0–77.0)
Platelets: 270 10*3/uL (ref 150.0–400.0)
RBC: 4.25 Mil/uL (ref 3.87–5.11)
RDW: 12.4 % (ref 11.5–15.5)
WBC: 5.7 10*3/uL (ref 4.0–10.5)

## 2017-12-30 LAB — LIPID PANEL
CHOL/HDL RATIO: 4
Cholesterol: 238 mg/dL — ABNORMAL HIGH (ref 0–200)
HDL: 62.4 mg/dL (ref >39.00)
LDL CALC: 140 mg/dL — AB (ref 0–99)
NonHDL: 175.68
TRIGLYCERIDES: 178 mg/dL — AB (ref 0.0–149.0)
VLDL: 35.6 mg/dL (ref 0.0–40.0)

## 2017-12-30 LAB — BASIC METABOLIC PANEL
BUN: 22 mg/dL (ref 6–23)
CALCIUM: 9.6 mg/dL (ref 8.4–10.5)
CO2: 30 mEq/L (ref 19–32)
Chloride: 105 mEq/L (ref 96–112)
Creatinine, Ser: 0.76 mg/dL (ref 0.40–1.20)
GFR: 81.54 mL/min (ref >60.00)
GLUCOSE: 88 mg/dL (ref 70–99)
Potassium: 5 mEq/L (ref 3.5–5.1)
SODIUM: 140 meq/L (ref 135–145)

## 2017-12-30 LAB — TSH: TSH: 4.01 u[IU]/mL (ref 0.35–4.50)

## 2017-12-30 NOTE — Assessment & Plan Note (Addendum)
Physical today 12/30/17.  PAP 12/26/16 - negative.  Atrophy changes.  Negative HPV.  Colonoscopy 06/24/17 - diverticulosis. Recommended f/u in 10 years.  F/u mammogram ordered.  Discussed shingles and tetanus.

## 2017-12-30 NOTE — Progress Notes (Signed)
Patient ID: Lisa Chen, female   DOB: 1955/10/22, 62 y.o.   MRN: [id number]   Subjective:    Patient ID: Lisa Chen, female    DOB: January 08, 1956, 62 y.o.   MRN: [id number]  HPI  Patient here for her physical exam.  She reports that she has been doing relatively well.  Repots that two weeks ago, she bent over and noticed increased dizziness (described as room spinning) with associated vomiting.  States symptoms lasted that day.  States if she rolled on her left side - room would spin.  States feels better now.  No recent infection.  Eating and drinking well.  No chest pain.  No sob.  Has had some dizziness previously.  Not as severe, but was worse with position changes.  No headache.  No dizziness now.  No nausea or vomiting.  No abdominal pain or bowel change.  Has had recent eye surgery.  Tries to stay active.     Past Medical History:  Diagnosis Date  . Cancer ([place])    melanoma  . Headache    3/WEEK, / DUE TO EYELIDS  . Osteopenia    NOT PROGRESSED IN LAST 10 YEARS  . Ptosis, both eyelids    UPPER   Past Surgical History:  Procedure Laterality Date  . ABDOMINAL HYSTERECTOMY  1995  . AUGMENTATION MAMMAPLASTY Bilateral 2000   SALINE  . BREAST EXCISIONAL BIOPSY Right 1990's   NEG  . BREAST SURGERY Right    BIOPSY  . BROW LIFT Bilateral 04/03/2015   Procedure: BLEPHAROPLASTY;  Surgeon: Karle Starch, MD;  Location: [place];  Service: Ophthalmology;  Laterality: Bilateral;  . [place] SURGERY  2001  . COLONOSCOPY WITH PROPOFOL N/A 06/24/2017   Procedure: COLONOSCOPY WITH PROPOFOL;  Surgeon: Robert Bellow, MD;  Location: [facility];  Service: Endoscopy;  Laterality: N/A;  . COSMETIC SURGERY     SKIN CANCER-MELANOMA ON BACK REMOVED-BASAL CELL ON CHEST ALSO  . EYE SURGERY Bilateral 2000 AND 2002   CATARACTS  . FOOT SURGERY    . HERNIA REPAIR  1984  . PTOSIS REPAIR Bilateral 04/03/2015   Procedure: PTOSIS REPAIR;  Surgeon: Karle Starch, MD;  Location:  [place];  Service: Ophthalmology;  Laterality: Bilateral;  . TONSILLECTOMY     Family History  Problem Relation Age of Onset  . Arthritis Mother   . Heart disease Mother   . Diabetes Mother   . Heart disease Maternal Grandmother   . Heart disease Maternal Grandfather   . Diabetes Maternal Grandfather   . Colon cancer Cousin   . Colon cancer Maternal Uncle   . Breast cancer Neg Hx    Social History   Socioeconomic History  . Marital status: Married    Spouse name: Not on file  . Number of children: Not on file  . Years of education: Not on file  . Highest education level: Not on file  Occupational History  . Not on file  Social Needs  . Financial resource strain: Not on file  . Food insecurity:    Worry: Not on file    Inability: Not on file  . Transportation needs:    Medical: Not on file    Non-medical: Not on file  Tobacco Use  . Smoking status: Never Smoker  . Smokeless tobacco: Never Used  Substance and Sexual Activity  . Alcohol use: Yes    Alcohol/week: 3.0 standard drinks    Types: 3 Glasses of wine  per week    Comment: OCCAS. none last 24hrs  . Drug use: No  . Sexual activity: Not on file  Lifestyle  . Physical activity:    Days per week: Not on file    Minutes per session: Not on file  . Stress: Not on file  Relationships  . Social connections:    Talks on phone: Not on file    Gets together: Not on file    Attends religious service: Not on file    Active member of club or organization: Not on file    Attends meetings of clubs or organizations: Not on file    Relationship status: Not on file  Other Topics Concern  . Not on file  Social History Narrative  . Not on file    Outpatient Encounter Medications as of 12/30/2017  Medication Sig  . estradiol (VIVELLE-DOT) 0.0375 MG/24HR APPLY 1 PATCH TWICE A WEEK   No facility-administered encounter medications on file as of 12/30/2017.     Review of Systems  Constitutional: Negative for  appetite change and unexpected weight change.  HENT: Negative for congestion and sinus pressure.   Eyes: Negative for pain and visual disturbance.  Respiratory: Negative for cough, chest tightness and shortness of breath.   Cardiovascular: Negative for chest pain, palpitations and leg swelling.  Gastrointestinal: Negative for abdominal pain, diarrhea, nausea and vomiting.  Genitourinary: Negative for difficulty urinating and dysuria.  Musculoskeletal: Negative for joint swelling and myalgias.  Skin: Negative for color change and rash.  Neurological: Positive for dizziness. Negative for headaches.  Hematological: Negative for adenopathy. Does not bruise/bleed easily.  Psychiatric/Behavioral: Negative for agitation and dysphoric mood.       Objective:    Physical Exam  Constitutional: She is oriented to person, place, and time. She appears well-developed and well-nourished. No distress.  HENT:  Nose: Nose normal.  Mouth/Throat: Oropharynx is clear and moist.  Eyes: Right eye exhibits no discharge. Left eye exhibits no discharge. No scleral icterus.  Neck: Neck supple. No thyromegaly present.  Cardiovascular: Normal rate and regular rhythm.  Pulmonary/Chest: Breath sounds normal. No accessory muscle usage. No tachypnea. No respiratory distress. She has no decreased breath sounds. She has no wheezes. She has no rhonchi. Right breast exhibits no inverted nipple, no mass, no nipple discharge and no tenderness (no axillary adenopathy). Left breast exhibits no inverted nipple, no mass, no nipple discharge and no tenderness (no axilarry adenopathy).  Abdominal: Soft. Bowel sounds are normal. There is no tenderness.  Musculoskeletal: She exhibits no edema or tenderness.  Lymphadenopathy:    She has no cervical adenopathy.  Neurological: She is alert and oriented to person, place, and time.  Skin: No rash noted. No erythema.  Psychiatric: She has a normal mood and affect. Her behavior is normal.     BP 106/62 (BP Location: Left Arm, Patient Position: Sitting, Cuff Size: Normal)   Pulse 80   Temp 97.9 F (36.6 C) (Oral)   Resp 18   Ht 5\' 2"  (1.575 m)   Wt 122 lb 3.2 oz (55.4 kg)   SpO2 98%   BMI 22.35 kg/m  Wt Readings from Last 3 Encounters:  12/30/17 122 lb 3.2 oz (55.4 kg)  06/24/17 120 lb (54.4 kg)  05/20/17 126 lb 12.8 oz (57.5 kg)     Lab Results  Component Value Date   WBC 5.7 12/30/2017   HGB 13.3 12/30/2017   HCT 39.7 12/30/2017   PLT 270.0 12/30/2017   GLUCOSE 88 12/30/2017  CHOL 238 (H) 12/30/2017   TRIG 178.0 (H) 12/30/2017   HDL 62.40 12/30/2017   LDLCALC 140 (H) 12/30/2017   ALT 15 12/30/2017   AST 20 12/30/2017   NA 140 12/30/2017   K 5.0 12/30/2017   CL 105 12/30/2017   CREATININE 0.76 12/30/2017   BUN 22 12/30/2017   CO2 30 12/30/2017   TSH 4.01 12/30/2017       Assessment & Plan:   Problem List Items Addressed This Visit    Current use of estrogen therapy    Desires to remain on estrogen.        Dizziness    Had the episode as outlined.  Worse when rolled over on her left side.  No headache.  Some associated nausea/vomiting.  Better now.  Still occasionally will notice a little sensation with certain movements.  Will have ENT evaluate.        Relevant Orders   CBC with Differential/Platelet (Completed)   Hepatic function panel (Completed)   TSH (Completed)   Basic metabolic panel (Completed)   Ambulatory referral to ENT   Health care maintenance    Physical today 12/30/17.  PAP 12/26/16 - negative.  Atrophy changes.  Negative HPV.  Colonoscopy 06/24/17 - diverticulosis. Recommended f/u in 10 years.  F/u mammogram ordered.  Discussed shingles and tetanus.        Hypercholesterolemia    Low cholesterol diet and exercise.  Follow lipid panel.         Other Visit Diagnoses    Routine general medical examination at a health care facility    -  Primary   Breast cancer screening       Relevant Orders   MM 3D SCREEN BREAST  W/IMPLANT BILATERAL   Screening cholesterol level       Relevant Orders   Lipid panel (Completed)   Need for influenza vaccination       Relevant Orders   Flu Vaccine QUAD 6+ mos PF IM (Fluarix Quad PF) (Completed)       Einar Pheasant, MD

## 2017-12-31 ENCOUNTER — Other Ambulatory Visit: Payer: Self-pay | Admitting: Internal Medicine

## 2017-12-31 DIAGNOSIS — E875 Hyperkalemia: Secondary | ICD-10-CM

## 2017-12-31 NOTE — Progress Notes (Signed)
Order placed for f/u potassium check.  

## 2018-01-02 NOTE — Assessment & Plan Note (Signed)
Desires to remain on estrogen.   

## 2018-01-02 NOTE — Assessment & Plan Note (Signed)
Had the episode as outlined.  Worse when rolled over on her left side.  No headache.  Some associated nausea/vomiting.  Better now.  Still occasionally will notice a little sensation with certain movements.  Will have ENT evaluate.

## 2018-01-02 NOTE — Assessment & Plan Note (Signed)
Low cholesterol diet and exercise.  Follow lipid panel.   

## 2018-01-08 ENCOUNTER — Encounter: Payer: Self-pay | Admitting: Internal Medicine

## 2018-01-08 ENCOUNTER — Other Ambulatory Visit (INDEPENDENT_AMBULATORY_CARE_PROVIDER_SITE_OTHER): Payer: BLUE CROSS/BLUE SHIELD

## 2018-01-08 DIAGNOSIS — E875 Hyperkalemia: Secondary | ICD-10-CM | POA: Diagnosis not present

## 2018-01-08 LAB — POTASSIUM: POTASSIUM: 4.5 meq/L (ref 3.5–5.1)

## 2018-01-13 DIAGNOSIS — Z85828 Personal history of other malignant neoplasm of skin: Secondary | ICD-10-CM | POA: Diagnosis not present

## 2018-01-13 DIAGNOSIS — Z85820 Personal history of malignant melanoma of skin: Secondary | ICD-10-CM | POA: Diagnosis not present

## 2018-01-13 DIAGNOSIS — D2272 Melanocytic nevi of left lower limb, including hip: Secondary | ICD-10-CM | POA: Diagnosis not present

## 2018-01-13 DIAGNOSIS — D2261 Melanocytic nevi of right upper limb, including shoulder: Secondary | ICD-10-CM | POA: Diagnosis not present

## 2018-01-15 ENCOUNTER — Ambulatory Visit
Admission: RE | Admit: 2018-01-15 | Discharge: 2018-01-15 | Disposition: A | Discharge status: Home / Self Care | Payer: BLUE CROSS/BLUE SHIELD | Source: Ambulatory Visit | Attending: Internal Medicine | Admitting: Internal Medicine

## 2018-01-15 DIAGNOSIS — Z1239 Encounter for other screening for malignant neoplasm of breast: Secondary | ICD-10-CM

## 2018-01-15 DIAGNOSIS — Z1231 Encounter for screening mammogram for malignant neoplasm of breast: Secondary | ICD-10-CM | POA: Diagnosis not present

## 2018-01-20 DIAGNOSIS — H60549 Acute eczematoid otitis externa, unspecified ear: Secondary | ICD-10-CM | POA: Diagnosis not present

## 2018-01-20 DIAGNOSIS — R42 Dizziness and giddiness: Secondary | ICD-10-CM | POA: Diagnosis not present

## 2018-01-29 DIAGNOSIS — R42 Dizziness and giddiness: Secondary | ICD-10-CM | POA: Diagnosis not present

## 2018-03-05 ENCOUNTER — Ambulatory Visit: Payer: BLUE CROSS/BLUE SHIELD | Admitting: Internal Medicine

## 2018-03-05 ENCOUNTER — Encounter: Payer: Self-pay | Admitting: Internal Medicine

## 2018-03-05 DIAGNOSIS — E7800 Pure hypercholesterolemia, unspecified: Secondary | ICD-10-CM | POA: Diagnosis not present

## 2018-03-05 DIAGNOSIS — R42 Dizziness and giddiness: Secondary | ICD-10-CM | POA: Diagnosis not present

## 2018-03-05 NOTE — Progress Notes (Signed)
Patient ID: Lisa Chen, female   DOB: [birth year], 62 y.o.   MRN: [id number]   Subjective:    Patient ID: Lisa Chen, female    DOB: 01/29/1956, 62 y.o.   MRN: [id number]  HPI  Patient here for a scheduled follow up.  Has been having issues with dizziness.  See last note for details.  Since her visit here, she has seen ENT and has gone to vestibular therapy.  Was better by the time she was evaluated, but ENT felt related to vertigo.  States she is doing better.  Still will have some intermittent issues/flares.  Notices if looks up or reaching up.  Brief episodes with certain positions.  Worse on left side.  No chest pain.  No sob.  Stays active.  No acid reflux.  No abdominal pain.  No headache.     Past Medical History:  Diagnosis Date  . Cancer ([place])    melanoma  . Headache    3/WEEK, / DUE TO EYELIDS  . Osteopenia    NOT PROGRESSED IN LAST 10 YEARS  . Ptosis, both eyelids    UPPER   Past Surgical History:  Procedure Laterality Date  . ABDOMINAL HYSTERECTOMY  1995  . AUGMENTATION MAMMAPLASTY Bilateral 2000   SALINE  . BREAST EXCISIONAL BIOPSY Right 1990's   NEG  . BREAST SURGERY Right    BIOPSY  . BROW LIFT Bilateral 04/03/2015   Procedure: BLEPHAROPLASTY;  Surgeon: Karle Starch, MD;  Location: [place];  Service: Ophthalmology;  Laterality: Bilateral;  . [place] SURGERY  2001  . COLONOSCOPY WITH PROPOFOL N/A 06/24/2017   Procedure: COLONOSCOPY WITH PROPOFOL;  Surgeon: Robert Bellow, MD;  Location: [facility];  Service: Endoscopy;  Laterality: N/A;  . COSMETIC SURGERY     SKIN CANCER-MELANOMA ON BACK REMOVED-BASAL CELL ON CHEST ALSO  . EYE SURGERY Bilateral 2000 AND 2002   CATARACTS  . FOOT SURGERY    . HERNIA REPAIR  1984  . PTOSIS REPAIR Bilateral 04/03/2015   Procedure: PTOSIS REPAIR;  Surgeon: Karle Starch, MD;  Location: [place];  Service: Ophthalmology;  Laterality: Bilateral;  . TONSILLECTOMY     Family History    Problem Relation Age of Onset  . Arthritis Mother   . Heart disease Mother   . Diabetes Mother   . Heart disease Maternal Grandmother   . Heart disease Maternal Grandfather   . Diabetes Maternal Grandfather   . Colon cancer Cousin   . Colon cancer Maternal Uncle   . Breast cancer Neg Hx    Social History   Socioeconomic History  . Marital status: Married    Spouse name: Not on file  . Number of children: Not on file  . Years of education: Not on file  . Highest education level: Not on file  Occupational History  . Not on file  Social Needs  . Financial resource strain: Not on file  . Food insecurity:    Worry: Not on file    Inability: Not on file  . Transportation needs:    Medical: Not on file    Non-medical: Not on file  Tobacco Use  . Smoking status: Never Smoker  . Smokeless tobacco: Never Used  Substance and Sexual Activity  . Alcohol use: Yes    Alcohol/week: 3.0 standard drinks    Types: 3 Glasses of wine per week    Comment: OCCAS. none last 24hrs  . Drug use: No  . Sexual  activity: Not on file  Lifestyle  . Physical activity:    Days per week: Not on file    Minutes per session: Not on file  . Stress: Not on file  Relationships  . Social connections:    Talks on phone: Not on file    Gets together: Not on file    Attends religious service: Not on file    Active member of club or organization: Not on file    Attends meetings of clubs or organizations: Not on file    Relationship status: Not on file  Other Topics Concern  . Not on file  Social History Narrative  . Not on file    Outpatient Encounter Medications as of 03/05/2018  Medication Sig  . estradiol (VIVELLE-DOT) 0.0375 MG/24HR APPLY 1 PATCH TWICE A WEEK   No facility-administered encounter medications on file as of 03/05/2018.     Review of Systems  Constitutional: Negative for appetite change and unexpected weight change.  HENT: Negative for congestion and sinus pressure.    Respiratory: Negative for cough, chest tightness and shortness of breath.   Cardiovascular: Negative for chest pain, palpitations and leg swelling.  Gastrointestinal: Negative for abdominal pain, diarrhea, nausea and vomiting.  Genitourinary: Negative for difficulty urinating and dysuria.  Musculoskeletal: Negative for joint swelling and myalgias.  Skin: Negative for color change and rash.  Neurological: Positive for dizziness and light-headedness. Negative for headaches.  Psychiatric/Behavioral: Negative for agitation and dysphoric mood.       Objective:    Physical Exam  Constitutional: She appears well-developed and well-nourished. No distress.  HENT:  Nose: Nose normal.  Mouth/Throat: Oropharynx is clear and moist.  Neck: Neck supple. No thyromegaly present.  Cardiovascular: Normal rate and regular rhythm.  Pulmonary/Chest: Breath sounds normal. No respiratory distress. She has no wheezes.  Abdominal: Soft. Bowel sounds are normal. There is no tenderness.  Musculoskeletal: She exhibits no edema or tenderness.  Lymphadenopathy:    She has no cervical adenopathy.  Neurological:  Reproducible symptoms with lying flat and looking to the left.    Skin: No rash noted. No erythema.  Psychiatric: She has a normal mood and affect. Her behavior is normal.    BP 100/68 (BP Location: Left Arm, Patient Position: Sitting, Cuff Size: Normal)   Pulse 75   Temp 98.5 F (36.9 C) (Oral)   Resp 18   Wt 125 lb (56.7 kg)   SpO2 96%   BMI 22.86 kg/m  Wt Readings from Last 3 Encounters:  03/05/18 125 lb (56.7 kg)  12/30/17 122 lb 3.2 oz (55.4 kg)  06/24/17 120 lb (54.4 kg)     Lab Results  Component Value Date   WBC 5.7 12/30/2017   HGB 13.3 12/30/2017   HCT 39.7 12/30/2017   PLT 270.0 12/30/2017   GLUCOSE 88 12/30/2017   CHOL 238 (H) 12/30/2017   TRIG 178.0 (H) 12/30/2017   HDL 62.40 12/30/2017   LDLCALC 140 (H) 12/30/2017   ALT 15 12/30/2017   AST 20 12/30/2017   NA 140  12/30/2017   K 4.5 01/08/2018   CL 105 12/30/2017   CREATININE 0.76 12/30/2017   BUN 22 12/30/2017   CO2 30 12/30/2017   TSH 4.01 12/30/2017    Mm 3d Screen Breast W/implant Bilateral  Result Date: 01/15/2018 CLINICAL DATA:  Screening. EXAM: DIGITAL SCREENING BILATERAL MAMMOGRAM WITH IMPLANTS, CAD AND TOMO The patient has retropectoral implants. Standard and implant displaced views were performed. COMPARISON:  Previous exam(s). ACR Breast Density  Category b: There are scattered areas of fibroglandular density. FINDINGS: There are no findings suspicious for malignancy. Images were processed with CAD. IMPRESSION: No mammographic evidence of malignancy. A result letter of this screening mammogram will be mailed directly to the patient. RECOMMENDATION: Screening mammogram in one year. (Code:SM-B-01Y) BI-RADS CATEGORY  1:  Negative. Electronically Signed   By: Dorise Bullion III M.D   On: 01/15/2018 15:50       Assessment & Plan:   Problem List Items Addressed This Visit    Dizziness    Evaluated by ENT.  Felt to be c/w vertigo.  Referred for vestibular therapy.  Recent flare.  Reproducible on exam.  Plans to call for treatment.  No headache. Otherwise doing well.  Hold on further w/up at this time.        Hypercholesterolemia    Low cholesterol deit and exercise.  Follow lipid panel.            Einar Pheasant, MD

## 2018-03-07 ENCOUNTER — Encounter: Payer: Self-pay | Admitting: Internal Medicine

## 2018-03-07 NOTE — Assessment & Plan Note (Signed)
Low cholesterold eit and exercise.  Follow lipid panel.   

## 2018-03-07 NOTE — Assessment & Plan Note (Signed)
Evaluated by ENT.  Felt to be c/w vertigo.  Referred for vestibular therapy.  Recent flare.  Reproducible on exam.  Plans to call for treatment.  No headache. Otherwise doing well.  Hold on further w/up at this time.

## 2018-05-14 ENCOUNTER — Other Ambulatory Visit: Payer: Self-pay | Admitting: Internal Medicine

## 2018-06-11 ENCOUNTER — Other Ambulatory Visit: Payer: Self-pay | Admitting: Internal Medicine

## 2018-06-11 NOTE — Telephone Encounter (Signed)
Copied from [place] [id number]. Topic: Quick Communication - Rx Refill/Question >> Jun 11, 2018 11:56 AM Alanda Slim E wrote: Medication: estradiol (VIVELLE-DOT) 0.0375 MG/24HR  Has the patient contacted their pharmacy? Yes - told to call PCP office   Preferred Pharmacy (with phone number or street name): [place], [place] - [place] [phone] (Phone) [phone] (Fax)    Agent: Please be advised that RX refills may take up to 3 business days. We ask that you follow-up with your pharmacy.

## 2018-07-14 DIAGNOSIS — L570 Actinic keratosis: Secondary | ICD-10-CM | POA: Diagnosis not present

## 2018-07-14 DIAGNOSIS — Z85820 Personal history of malignant melanoma of skin: Secondary | ICD-10-CM | POA: Diagnosis not present

## 2018-07-14 DIAGNOSIS — Z85828 Personal history of other malignant neoplasm of skin: Secondary | ICD-10-CM | POA: Diagnosis not present

## 2018-07-14 DIAGNOSIS — D2262 Melanocytic nevi of left upper limb, including shoulder: Secondary | ICD-10-CM | POA: Diagnosis not present

## 2018-07-14 DIAGNOSIS — D2272 Melanocytic nevi of left lower limb, including hip: Secondary | ICD-10-CM | POA: Diagnosis not present

## 2018-09-03 ENCOUNTER — Other Ambulatory Visit: Payer: Self-pay | Admitting: Internal Medicine

## 2018-09-09 ENCOUNTER — Ambulatory Visit: Payer: BLUE CROSS/BLUE SHIELD | Admitting: Internal Medicine

## 2018-11-03 ENCOUNTER — Other Ambulatory Visit: Payer: Self-pay | Admitting: Internal Medicine

## 2019-01-10 ENCOUNTER — Other Ambulatory Visit: Payer: Self-pay

## 2019-01-10 ENCOUNTER — Telehealth: Payer: Self-pay

## 2019-01-10 ENCOUNTER — Other Ambulatory Visit (HOSPITAL_COMMUNITY)
Admission: RE | Admit: 2019-01-10 | Discharge: 2019-01-10 | Disposition: A | Discharge status: Home / Self Care | Payer: 59 | Source: Ambulatory Visit | Attending: Internal Medicine | Admitting: Internal Medicine

## 2019-01-10 ENCOUNTER — Ambulatory Visit (INDEPENDENT_AMBULATORY_CARE_PROVIDER_SITE_OTHER): Payer: 59 | Admitting: Internal Medicine

## 2019-01-10 ENCOUNTER — Encounter: Payer: Self-pay | Admitting: Internal Medicine

## 2019-01-10 VITALS — BP 116/68 | HR 80 | Temp 97.7°F | Resp 16 | Wt 124.0 lb

## 2019-01-10 DIAGNOSIS — R42 Dizziness and giddiness: Secondary | ICD-10-CM

## 2019-01-10 DIAGNOSIS — Z23 Encounter for immunization: Secondary | ICD-10-CM

## 2019-01-10 DIAGNOSIS — Z13220 Encounter for screening for lipoid disorders: Secondary | ICD-10-CM | POA: Diagnosis not present

## 2019-01-10 DIAGNOSIS — R079 Chest pain, unspecified: Secondary | ICD-10-CM

## 2019-01-10 DIAGNOSIS — Z124 Encounter for screening for malignant neoplasm of cervix: Secondary | ICD-10-CM | POA: Insufficient documentation

## 2019-01-10 DIAGNOSIS — E7800 Pure hypercholesterolemia, unspecified: Secondary | ICD-10-CM | POA: Diagnosis not present

## 2019-01-10 DIAGNOSIS — Z0000 Encounter for general adult medical examination without abnormal findings: Secondary | ICD-10-CM | POA: Diagnosis not present

## 2019-01-10 DIAGNOSIS — Z79899 Other long term (current) drug therapy: Secondary | ICD-10-CM | POA: Diagnosis not present

## 2019-01-10 NOTE — Telephone Encounter (Signed)
FYI

## 2019-01-10 NOTE — Telephone Encounter (Signed)
Referral to Dr Arida 

## 2019-01-10 NOTE — Assessment & Plan Note (Signed)
Physical today 01/10/19.  PAP 01/10/19.  Colonoscopy 06/24/17 - diverticulosis.  Recommended f/u in 10 years.

## 2019-01-10 NOTE — Telephone Encounter (Signed)
Copied from [place] [id number]. Topic: Referral - Status >> Jan 10, 2019  4:37 PM Reyne Dumas L wrote: Reason for CRM:   Pt states that she has changed her mind on who she would like to see as a cardiologist.  Pt would like to see Dr. Fletcher Anon (unsure of spelling) at [facility].

## 2019-01-10 NOTE — Progress Notes (Signed)
Patient ID: Lisa Chen, female   DOB: 1955/08/10, 63 y.o.   MRN: [id number]   Subjective:    Patient ID: Lisa Chen, female    DOB: [birth year], 63 y.o.   MRN: [id number]  HPI  Patient here for her physical exam.  She reports she has noticed some chest discomfort at times.  Also notices she cannot take a deep breath - occurs occasionally.  Notices some symptoms when going up inclines.  Tries to stay active.  No acid reflux.  No abdominal pain.  Bowels moving.  Previously had issues with dizziness and was evaluated by ENT/vestibular rehab.  No dizziness now.     Past Medical History:  Diagnosis Date  . Cancer ([place])    melanoma  . Headache    3/WEEK, / DUE TO EYELIDS  . Osteopenia    NOT PROGRESSED IN LAST 10 YEARS  . Ptosis, both eyelids    UPPER   Past Surgical History:  Procedure Laterality Date  . ABDOMINAL HYSTERECTOMY  1995  . AUGMENTATION MAMMAPLASTY Bilateral 2000   SALINE  . BREAST EXCISIONAL BIOPSY Right 1990's   NEG  . BREAST SURGERY Right    BIOPSY  . BROW LIFT Bilateral 04/03/2015   Procedure: BLEPHAROPLASTY;  Surgeon: Karle Starch, MD;  Location: [place];  Service: Ophthalmology;  Laterality: Bilateral;  . [place] SURGERY  2001  . COLONOSCOPY WITH PROPOFOL N/A 06/24/2017   Procedure: COLONOSCOPY WITH PROPOFOL;  Surgeon: Robert Bellow, MD;  Location: [facility];  Service: Endoscopy;  Laterality: N/A;  . COSMETIC SURGERY     SKIN CANCER-MELANOMA ON BACK REMOVED-BASAL CELL ON CHEST ALSO  . EYE SURGERY Bilateral 2000 AND 2002   CATARACTS  . FOOT SURGERY    . HERNIA REPAIR  1984  . PTOSIS REPAIR Bilateral 04/03/2015   Procedure: PTOSIS REPAIR;  Surgeon: Karle Starch, MD;  Location: [place];  Service: Ophthalmology;  Laterality: Bilateral;  . TONSILLECTOMY     Family History  Problem Relation Age of Onset  . Arthritis Mother   . Heart disease Mother   . Diabetes Mother   . Heart disease Maternal Grandmother   .  Heart disease Maternal Grandfather   . Diabetes Maternal Grandfather   . Colon cancer Cousin   . Colon cancer Maternal Uncle   . Breast cancer Neg Hx    Social History   Socioeconomic History  . Marital status: Married    Spouse name: Not on file  . Number of children: Not on file  . Years of education: Not on file  . Highest education level: Not on file  Occupational History  . Not on file  Social Needs  . Financial resource strain: Not on file  . Food insecurity    Worry: Not on file    Inability: Not on file  . Transportation needs    Medical: Not on file    Non-medical: Not on file  Tobacco Use  . Smoking status: Never Smoker  . Smokeless tobacco: Never Used  Substance and Sexual Activity  . Alcohol use: Yes    Alcohol/week: 3.0 standard drinks    Types: 3 Glasses of wine per week    Comment: OCCAS. none last 24hrs  . Drug use: No  . Sexual activity: Not on file  Lifestyle  . Physical activity    Days per week: Not on file    Minutes per session: Not on file  . Stress: Not on file  Relationships  . Social Herbalist on phone: Not on file    Gets together: Not on file    Attends religious service: Not on file    Active member of club or organization: Not on file    Attends meetings of clubs or organizations: Not on file    Relationship status: Not on file  Other Topics Concern  . Not on file  Social History Narrative  . Not on file    Outpatient Encounter Medications as of 01/10/2019  Medication Sig  . DOTTI 0.0375 MG/24HR APPLY 1 PATCH TWICE A WEEK   No facility-administered encounter medications on file as of 01/10/2019.    Review of Systems  Constitutional: Negative for appetite change and unexpected weight change.  HENT: Negative for congestion and sinus pressure.   Eyes: Negative for pain and visual disturbance.  Respiratory: Negative for cough and chest tightness.        Feelings at times like she cannot get a good breath.     Cardiovascular: Positive for chest pain. Negative for palpitations and leg swelling.  Gastrointestinal: Negative for abdominal pain, diarrhea, nausea and vomiting.  Genitourinary: Negative for difficulty urinating and dysuria.  Musculoskeletal: Negative for joint swelling and myalgias.  Skin: Negative for color change and rash.  Neurological: Negative for dizziness, light-headedness and headaches.  Hematological: Negative for adenopathy. Does not bruise/bleed easily.  Psychiatric/Behavioral: Negative for agitation and dysphoric mood.       Objective:    Physical Exam Constitutional:      General: She is not in acute distress.    Appearance: Normal appearance. She is well-developed.  HENT:     Right Ear: External ear normal.     Left Ear: External ear normal.  Eyes:     General: No scleral icterus.       Right eye: No discharge.        Left eye: No discharge.     Conjunctiva/sclera: Conjunctivae normal.  Neck:     Musculoskeletal: Neck supple. No muscular tenderness.     Thyroid: No thyromegaly.  Cardiovascular:     Rate and Rhythm: Normal rate and regular rhythm.  Pulmonary:     Effort: No tachypnea, accessory muscle usage or respiratory distress.     Breath sounds: Normal breath sounds. No decreased breath sounds or wheezing.  Chest:     Breasts:        Right: No inverted nipple, mass, nipple discharge or tenderness (no axillary adenopathy).        Left: No inverted nipple, mass, nipple discharge or tenderness (no axilarry adenopathy).  Abdominal:     General: Bowel sounds are normal.     Palpations: Abdomen is soft.     Tenderness: There is no abdominal tenderness.  Genitourinary:    Comments: Normal external genitalia.  Vaginal vault without lesions.  S/p hysterectomy.  Pap smear performed.  Could not appreciate any adnexal masses or tenderness.   Musculoskeletal:        General: No swelling or tenderness.  Lymphadenopathy:     Cervical: No cervical adenopathy.   Skin:    Findings: No erythema or rash.  Neurological:     Mental Status: She is alert and oriented to person, place, and time.  Psychiatric:        Mood and Affect: Mood normal.        Behavior: Behavior normal.     BP 116/68   Pulse 80   Temp 97.7 F (36.5  C)   Resp 16   Wt 124 lb (56.2 kg)   SpO2 98%   BMI 22.68 kg/m  Wt Readings from Last 3 Encounters:  01/10/19 124 lb (56.2 kg)  03/05/18 125 lb (56.7 kg)  12/30/17 122 lb 3.2 oz (55.4 kg)     Lab Results  Component Value Date   WBC 5.7 12/30/2017   HGB 13.3 12/30/2017   HCT 39.7 12/30/2017   PLT 270.0 12/30/2017   GLUCOSE 88 12/30/2017   CHOL 238 (H) 12/30/2017   TRIG 178.0 (H) 12/30/2017   HDL 62.40 12/30/2017   LDLCALC 140 (H) 12/30/2017   ALT 15 12/30/2017   AST 20 12/30/2017   NA 140 12/30/2017   K 4.5 01/08/2018   CL 105 12/30/2017   CREATININE 0.76 12/30/2017   BUN 22 12/30/2017   CO2 30 12/30/2017   TSH 4.01 12/30/2017    Mm 3d Screen Breast W/implant Bilateral  Result Date: 01/15/2018 CLINICAL DATA:  Screening. EXAM: DIGITAL SCREENING BILATERAL MAMMOGRAM WITH IMPLANTS, CAD AND TOMO The patient has retropectoral implants. Standard and implant displaced views were performed. COMPARISON:  Previous exam(s). ACR Breast Density Category b: There are scattered areas of fibroglandular density. FINDINGS: There are no findings suspicious for malignancy. Images were processed with CAD. IMPRESSION: No mammographic evidence of malignancy. A result letter of this screening mammogram will be mailed directly to the patient. RECOMMENDATION: Screening mammogram in one year. (Code:SM-B-01Y) BI-RADS CATEGORY  1:  Negative. Electronically Signed   By: Dorise Bullion III M.D   On: 01/15/2018 15:50       Assessment & Plan:   Problem List Items Addressed This Visit    Chest pain - Primary    Reports chest pain as outlined.  Notices with inclines.  Occasional feeling like she cannot get a good breath.  EKG - SR with no  acute ischemic changes. Discussed further w/up and evaluation.  Discussed cardiology referral.  She is in agreement.        Relevant Orders   EKG 12-Lead (Completed)   Ambulatory referral to Cardiology   CBC with Differential/Platelet   Comprehensive metabolic panel   TSH   Current use of estrogen therapy    Discussed with her today. Discussed tapering off patch. Discussed possible risk and side effects of estrogen therapy.  She elects to remain on estrogen for now.        Dizziness    Previously evaluated by ENT.  Felt to be c/w vertigo.  Referred for vestibular therapy.  Overall doing well now.        Health care maintenance    Physical today 01/10/19.  PAP 01/10/19.  Colonoscopy 06/24/17 - diverticulosis.  Recommended f/u in 10 years.        Hypercholesterolemia    Low cholesterol diet and exercise.  Follow lipid panel.        Other Visit Diagnoses    Cervical cancer screening       Relevant Orders   Cytology - PAP( [place]) (Completed)   Need for immunization against influenza       Relevant Orders   Flu Vaccine QUAD 36+ mos IM (Completed)   Screening cholesterol level       Relevant Orders   Lipid panel       Einar Pheasant, MD

## 2019-01-12 LAB — CYTOLOGY - PAP
Adequacy: ABSENT
Diagnosis: NEGATIVE
HPV: NOT DETECTED

## 2019-01-15 ENCOUNTER — Encounter: Payer: Self-pay | Admitting: Internal Medicine

## 2019-01-16 ENCOUNTER — Encounter: Payer: Self-pay | Admitting: Internal Medicine

## 2019-01-16 DIAGNOSIS — R079 Chest pain, unspecified: Secondary | ICD-10-CM | POA: Insufficient documentation

## 2019-01-16 NOTE — Assessment & Plan Note (Signed)
Low cholesterol diet and exercise.  Follow lipid panel.   

## 2019-01-16 NOTE — Assessment & Plan Note (Signed)
Discussed with her today. Discussed tapering off patch. Discussed possible risk and side effects of estrogen therapy.  She elects to remain on estrogen for now.

## 2019-01-16 NOTE — Assessment & Plan Note (Signed)
Previously evaluated by ENT.  Felt to be c/w vertigo.  Referred for vestibular therapy.  Overall doing well now.

## 2019-01-16 NOTE — Assessment & Plan Note (Signed)
Reports chest pain as outlined.  Notices with inclines.  Occasional feeling like she cannot get a good breath.  EKG - SR with no acute ischemic changes. Discussed further w/up and evaluation.  Discussed cardiology referral.  She is in agreement.

## 2019-01-18 ENCOUNTER — Other Ambulatory Visit (INDEPENDENT_AMBULATORY_CARE_PROVIDER_SITE_OTHER): Payer: 59

## 2019-01-18 ENCOUNTER — Other Ambulatory Visit: Payer: Self-pay

## 2019-01-18 DIAGNOSIS — Z13220 Encounter for screening for lipoid disorders: Secondary | ICD-10-CM | POA: Diagnosis not present

## 2019-01-18 DIAGNOSIS — R079 Chest pain, unspecified: Secondary | ICD-10-CM

## 2019-01-18 LAB — CBC WITH DIFFERENTIAL/PLATELET
Basophils Absolute: 0 10*3/uL (ref 0.0–0.1)
Basophils Relative: 0.6 % (ref 0.0–3.0)
Eosinophils Absolute: 0.1 10*3/uL (ref 0.0–0.7)
Eosinophils Relative: 2.4 % (ref 0.0–5.0)
HCT: 38.6 % (ref 36.0–46.0)
Hemoglobin: 13 g/dL (ref 12.0–15.0)
Lymphocytes Relative: 27.4 % (ref 12.0–46.0)
Lymphs Abs: 1.3 10*3/uL (ref 0.7–4.0)
MCHC: 33.7 g/dL (ref 30.0–36.0)
MCV: 93.6 fl (ref 78.0–100.0)
Monocytes Absolute: 0.6 10*3/uL (ref 0.1–1.0)
Monocytes Relative: 11.7 % (ref 3.0–12.0)
Neutro Abs: 2.8 10*3/uL (ref 1.4–7.7)
Neutrophils Relative %: 57.9 % (ref 43.0–77.0)
Platelets: 282 10*3/uL (ref 150.0–400.0)
RBC: 4.13 Mil/uL (ref 3.87–5.11)
RDW: 12.5 % (ref 11.5–15.5)
WBC: 4.8 10*3/uL (ref 4.0–10.5)

## 2019-01-18 LAB — COMPREHENSIVE METABOLIC PANEL
ALT: 15 U/L (ref 0–35)
AST: 19 U/L (ref 0–37)
Albumin: 4.3 g/dL (ref 3.5–5.2)
Alkaline Phosphatase: 52 U/L (ref 39–117)
BUN: 18 mg/dL (ref 6–23)
CO2: 30 mEq/L (ref 19–32)
Calcium: 9.3 mg/dL (ref 8.4–10.5)
Chloride: 104 mEq/L (ref 96–112)
Creatinine, Ser: 0.69 mg/dL (ref 0.40–1.20)
GFR: 85.48 mL/min (ref >60.00)
Glucose, Bld: 87 mg/dL (ref 70–99)
Potassium: 4.1 mEq/L (ref 3.5–5.1)
Sodium: 138 mEq/L (ref 135–145)
Total Bilirubin: 1.1 mg/dL (ref 0.2–1.2)
Total Protein: 7 g/dL (ref 6.0–8.3)

## 2019-01-18 LAB — LIPID PANEL
Cholesterol: 235 mg/dL — ABNORMAL HIGH (ref 0–200)
HDL: 66.9 mg/dL (ref >39.00)
LDL Cholesterol: 147 mg/dL — ABNORMAL HIGH (ref 0–99)
NonHDL: 168.03
Total CHOL/HDL Ratio: 4
Triglycerides: 103 mg/dL (ref 0.0–149.0)
VLDL: 20.6 mg/dL (ref 0.0–40.0)

## 2019-01-18 LAB — TSH: TSH: 2.22 u[IU]/mL (ref 0.35–4.50)

## 2019-01-19 ENCOUNTER — Encounter: Payer: Self-pay | Admitting: Internal Medicine

## 2019-01-31 ENCOUNTER — Other Ambulatory Visit: Payer: Self-pay | Admitting: Internal Medicine

## 2019-01-31 ENCOUNTER — Other Ambulatory Visit: Payer: Self-pay | Admitting: Neurosurgery

## 2019-01-31 DIAGNOSIS — Z1231 Encounter for screening mammogram for malignant neoplasm of breast: Secondary | ICD-10-CM

## 2019-02-01 ENCOUNTER — Other Ambulatory Visit: Payer: Self-pay | Admitting: Internal Medicine

## 2019-02-08 ENCOUNTER — Ambulatory Visit: Payer: 59 | Admitting: Cardiovascular Disease

## 2019-02-16 ENCOUNTER — Other Ambulatory Visit: Payer: Self-pay

## 2019-02-16 ENCOUNTER — Ambulatory Visit
Admission: RE | Admit: 2019-02-16 | Discharge: 2019-02-16 | Disposition: A | Discharge status: Home / Self Care | Payer: 59 | Source: Ambulatory Visit | Attending: Internal Medicine | Admitting: Internal Medicine

## 2019-02-16 DIAGNOSIS — Z1231 Encounter for screening mammogram for malignant neoplasm of breast: Secondary | ICD-10-CM | POA: Insufficient documentation

## 2019-03-01 ENCOUNTER — Other Ambulatory Visit: Payer: Self-pay | Admitting: Internal Medicine

## 2019-04-11 ENCOUNTER — Ambulatory Visit: Payer: 59 | Admitting: Internal Medicine

## 2019-04-27 ENCOUNTER — Other Ambulatory Visit: Payer: Self-pay | Admitting: Internal Medicine

## 2019-04-28 DIAGNOSIS — L814 Other melanin hyperpigmentation: Secondary | ICD-10-CM | POA: Diagnosis not present

## 2019-05-17 ENCOUNTER — Ambulatory Visit: Payer: 59 | Admitting: Cardiovascular Disease

## 2019-05-31 ENCOUNTER — Other Ambulatory Visit: Payer: Self-pay | Admitting: Internal Medicine

## 2019-06-30 ENCOUNTER — Ambulatory Visit: Payer: 59 | Admitting: Cardiovascular Disease

## 2019-07-13 DIAGNOSIS — D2261 Melanocytic nevi of right upper limb, including shoulder: Secondary | ICD-10-CM | POA: Diagnosis not present

## 2019-07-13 DIAGNOSIS — D2271 Melanocytic nevi of right lower limb, including hip: Secondary | ICD-10-CM | POA: Diagnosis not present

## 2019-07-13 DIAGNOSIS — Z85820 Personal history of malignant melanoma of skin: Secondary | ICD-10-CM | POA: Diagnosis not present

## 2019-07-13 DIAGNOSIS — L570 Actinic keratosis: Secondary | ICD-10-CM | POA: Diagnosis not present

## 2019-07-13 DIAGNOSIS — X32XXXA Exposure to sunlight, initial encounter: Secondary | ICD-10-CM | POA: Diagnosis not present

## 2019-07-13 DIAGNOSIS — Z85828 Personal history of other malignant neoplasm of skin: Secondary | ICD-10-CM | POA: Diagnosis not present

## 2019-07-13 DIAGNOSIS — L821 Other seborrheic keratosis: Secondary | ICD-10-CM | POA: Diagnosis not present

## 2019-08-25 ENCOUNTER — Encounter: Payer: Self-pay | Admitting: Cardiovascular Disease

## 2019-08-25 ENCOUNTER — Other Ambulatory Visit: Payer: Self-pay

## 2019-08-25 ENCOUNTER — Ambulatory Visit (INDEPENDENT_AMBULATORY_CARE_PROVIDER_SITE_OTHER): Payer: Medicare HMO | Admitting: Cardiovascular Disease

## 2019-08-25 DIAGNOSIS — R072 Precordial pain: Secondary | ICD-10-CM | POA: Diagnosis not present

## 2019-08-25 MED ORDER — METOPROLOL TARTRATE 100 MG PO TABS: ORAL_TABLET | ORAL | 0 refills | Status: DC

## 2019-08-25 NOTE — Patient Instructions (Signed)
Medication Instructions:  Your physician recommends that you continue on your current medications as directed. Please refer to the Current Medication list given to you today.  A onte time does of Metoprolol 100mg  has been sent to your pharmacy to be taken 2 hours prior to your Cardiac CTA  *If you need a refill on your cardiac medications before your next appointment, please call your pharmacy*   Lab Work: None ordered If you have labs (blood work) drawn today and your tests are completely normal, you will receive your results only by: Marland Kitchen MyChart Message (if you have MyChart) OR . A paper copy in the mail If you have any lab test that is abnormal or we need to change your treatment, we will call you to review the results.   Testing/Procedures: Your physician has requested that you have cardiac CT. Cardiac computed tomography (CT) is a painless test that uses an x-ray machine to take clear, detailed pictures of your heart. For further information please visit [email or web address]. Please follow instruction sheet as given.      Follow-Up: At [facility], you and your health needs are our priority.  As part of our continuing mission to provide you with exceptional heart care, we have created designated Provider Care Teams.  These Care Teams include your primary Cardiologist (physician) and Advanced Practice Providers (APPs -  Physician Assistants and Nurse Practitioners) who all work together to provide you with the care you need, when you need it.  We recommend signing up for the patient portal called "MyChart".  Sign up information is provided on this After Visit Summary.  MyChart is used to connect with patients for Virtual Visits (Telemedicine).  Patients are able to view lab/test results, encounter notes, upcoming appointments, etc.  Non-urgent messages can be sent to your provider as well.   To learn more about what you can do with MyChart, go to [email or web address].    Your next  appointment:   As needed   The format for your next appointment:   In Person  Provider:    You may see  Dr. Fletcher Anon or one of the following Advanced Practice Providers on your designated Care Team:    Murray Hodgkins, NP  Christell Faith, PA-C  Marrianne Mood, PA-C    Other Instructions Your cardiac CT will be scheduled at one of the below locations:   [facility] [address] [place], [place] [address] [phone]  [place] [address] [place], [place] [address] [phone]  If scheduled at [facility], please arrive at the [facility] main entrance of [facility] 30 minutes prior to test start time. Proceed to the [facility] Radiology Department (first floor) to check-in and test prep.  If scheduled at [facility], please arrive 15 mins early for check-in and test prep.  Please follow these instructions carefully (unless otherwise directed):   On the Night Before the Test: . Be sure to Drink plenty of water. . Do not consume any caffeinated/decaffeinated beverages or chocolate 12 hours prior to your test. . Do not take any antihistamines 12 hours prior to your test.  On the Day of the Test: . Drink plenty of water. Do not drink any water within one hour of the test. . Do not eat any food 4 hours prior to the test. . You may take your regular medications prior to the test.  . Take metoprolol (Lopressor) two hours  prior to test. . FEMALES- please wear underwire-free bra if available       After the Test: . Drink plenty of water. . After receiving IV contrast, you may experience a mild flushed feeling. This is normal. . On occasion, you may experience a mild rash up to 24 hours after the test. This is not dangerous. If this occurs, you can take Benadryl 25 mg and increase your fluid intake. . If you experience trouble breathing, this can be serious.  If it is severe call [phone] IMMEDIATELY. If it is mild, please call our office.  Once we have confirmed authorization from your insurance company, we will call you to set up a date and time for your test.   For non-scheduling related questions, please contact the cardiac imaging nurse navigator should you have any questions/concerns: Marchia Bond, RN Navigator Cardiac Imaging [facility] [phone] office  For scheduling needs, including cancellations and rescheduling, please call [phone].

## 2019-08-25 NOTE — Progress Notes (Signed)
Cardiology Office Note   Date:  08/25/2019   ID:  Lisa Chen, DOB Oct 05, 1955, MRN [id number]  PCP:  Lisa Pheasant, MD  Cardiologist:   Lisa Sacramento, MD   Chief Complaint  Patient presents with  . OTHER    Chest pain with exertion. Meds reviewed verbally with pt.      History of Present Illness: Lisa Chen is a 64 y.o. female who was referred by Lisa Chen for evaluation of chest pain.  She has no prior cardiac history and overall has been relatively healthy.  She has no history of hypertension, tobacco use or diabetes mellitus.  However, she does have known history of severe hyperlipidemia since she was in her 28s and it is possible that she might have familial hyperlipidemia.  She has strong family history of coronary artery disease in her mother side.  Over the last 6 months, she has experienced intermittent episodes of substernal chest pain and discomfort which initially was happening when she went up stairs few times.  It was associated with exertional dyspnea but it has not been consistent.  No lower extremity edema, orthopnea or PND. No prior cardiac evaluation.   Past Medical History:  Diagnosis Date  . Cancer ([place])    melanoma  . Headache    3/WEEK, / DUE TO EYELIDS  . Osteopenia    NOT PROGRESSED IN LAST 10 YEARS  . Ptosis, both eyelids    UPPER    Past Surgical History:  Procedure Laterality Date  . ABDOMINAL HYSTERECTOMY  1995  . AUGMENTATION MAMMAPLASTY Bilateral 2000   SALINE  . BREAST EXCISIONAL BIOPSY Right 1990's   NEG  . BREAST SURGERY Right    BIOPSY  . BROW LIFT Bilateral 04/03/2015   Procedure: BLEPHAROPLASTY;  Surgeon: Karle Starch, MD;  Location: [place];  Service: Ophthalmology;  Laterality: Bilateral;  . [place] SURGERY  2001  . COLONOSCOPY WITH PROPOFOL N/A 06/24/2017   Procedure: COLONOSCOPY WITH PROPOFOL;  Surgeon: Robert Bellow, MD;  Location: [facility];  Service: Endoscopy;  Laterality: N/A;  .  COSMETIC SURGERY     SKIN CANCER-MELANOMA ON BACK REMOVED-BASAL CELL ON CHEST ALSO  . EYE SURGERY Bilateral 2000 AND 2002   CATARACTS  . FOOT SURGERY    . HERNIA REPAIR  1984  . PTOSIS REPAIR Bilateral 04/03/2015   Procedure: PTOSIS REPAIR;  Surgeon: Karle Starch, MD;  Location: [place];  Service: Ophthalmology;  Laterality: Bilateral;  . TONSILLECTOMY       No current outpatient medications on file.   No current facility-administered medications for this visit.    Allergies:   Erythromycin and Other    Social History:  The patient  reports that she has never smoked. She has never used smokeless tobacco. She reports current alcohol use of about 3.0 standard drinks of alcohol per week. She reports that she does not use drugs.   Family History:  The patient's family history includes Arthritis in her mother; Colon cancer in her cousin and maternal uncle; Diabetes in her maternal grandfather and mother; Heart attack in her father and maternal uncle; Heart disease in her maternal grandfather, maternal grandmother, and mother; Stroke in her maternal uncle.    ROS:  Please see the history of present illness.   Otherwise, review of systems are positive for none.   All other systems are reviewed and negative.    PHYSICAL EXAM: VS:  BP 116/74 (BP Location: Right Arm, Patient  Position: Sitting, Cuff Size: Normal)   Pulse 77   Ht 5\' 3"  (1.6 m)   Wt 119 lb 8 oz (54.2 kg)   SpO2 98%   BMI 21.17 kg/m  , BMI Body mass index is 21.17 kg/m. GEN: Well nourished, well developed, in no acute distress  HEENT: normal  Neck: no JVD, carotid bruits, or masses Cardiac: RRR; no murmurs, rubs, or gallops,no edema  Respiratory:  clear to auscultation bilaterally, normal work of breathing GI: soft, nontender, nondistended, + BS MS: no deformity or atrophy  Skin: warm and dry, no rash Neuro:  Strength and sensation are intact Psych: euthymic mood, full affect   EKG:  EKG is ordered  today. The ekg ordered today demonstrates normal sinus rhythm with no significant ST or T wave changes.   Recent Labs: 01/18/2019: ALT 15; BUN 18; Creatinine, Ser 0.69; Hemoglobin 13.0; Platelets 282.0; Potassium 4.1; Sodium 138; TSH 2.22    Lipid Panel    Component Value Date/Time   CHOL 235 (H) 01/18/2019 1020   TRIG 103.0 01/18/2019 1020   HDL 66.90 01/18/2019 1020   CHOLHDL 4 01/18/2019 1020   VLDL 20.6 01/18/2019 1020   LDLCALC 147 (H) 01/18/2019 1020      Wt Readings from Last 3 Encounters:  08/25/19 119 lb 8 oz (54.2 kg)  01/10/19 124 lb (56.2 kg)  03/05/18 125 lb (56.7 kg)        PAD Screen 08/25/2019  Previous PAD dx? No  Previous surgical procedure? No  Pain with walking? No  Feet/toe relief with dangling? No  Painful, non-healing ulcers? No  Extremities discolored? No      ASSESSMENT AND PLAN:  1.  Atypical chest pain with exertional dyspnea: The patient has strong family history of coronary artery disease and has known history of hyperlipidemia for a long time.  Due to that, I recommend evaluation with CT a of the coronary arteries with FFR if needed.  This will help [place] determine atherosclerosis burden and the justification for more aggressive treatment of hyperlipidemia.  2.  Hyperlipidemia: If cardiac CT shows evidence of atherosclerosis, would be more aggressive treating this with the medication.  Otherwise if the CTA is normal, we can continue with aggressive lifestyle changes.    Disposition:   FU with me as needed.    Signed,  Lisa Sacramento, MD  08/25/2019 2:37 PM    [place]

## 2019-09-12 ENCOUNTER — Other Ambulatory Visit: Payer: Self-pay

## 2019-09-12 DIAGNOSIS — R079 Chest pain, unspecified: Secondary | ICD-10-CM

## 2019-09-13 ENCOUNTER — Other Ambulatory Visit
Admission: RE | Admit: 2019-09-13 | Discharge: 2019-09-13 | Disposition: A | Discharge status: Home / Self Care | Payer: Medicare HMO | Source: Ambulatory Visit | Attending: Cardiovascular Disease | Admitting: Cardiovascular Disease

## 2019-09-13 DIAGNOSIS — R079 Chest pain, unspecified: Secondary | ICD-10-CM

## 2019-09-13 LAB — BASIC METABOLIC PANEL
Anion gap: 8 (ref 5–15)
BUN: 22 mg/dL (ref 8–23)
CO2: 27 mmol/L (ref 22–32)
Calcium: 9.3 mg/dL (ref 8.9–10.3)
Chloride: 104 mmol/L (ref 98–111)
Creatinine, Ser: 0.67 mg/dL (ref 0.44–1.00)
GFR calc Af Amer: >60 mL/min (ref >60)
GFR calc non Af Amer: >60 mL/min (ref >60)
Glucose, Bld: 93 mg/dL (ref 70–99)
Potassium: 4.2 mmol/L (ref 3.5–5.1)
Sodium: 139 mmol/L (ref 135–145)

## 2019-09-14 ENCOUNTER — Telehealth (HOSPITAL_COMMUNITY): Payer: Self-pay | Admitting: *Deleted

## 2019-09-14 NOTE — Telephone Encounter (Signed)
Pt returning call regarding cardiac CT instructions.  Instructions reviewed and pt expressed understanding.

## 2019-09-14 NOTE — Telephone Encounter (Signed)
Attempted to call patient regarding upcoming cardiac CT appointment. Left message on voicemail with name and callback number  Merle Tai RN Navigator Cardiac Imaging [facility] [phone] Office [phone] Cell  

## 2019-09-15 ENCOUNTER — Other Ambulatory Visit: Payer: Self-pay

## 2019-09-15 ENCOUNTER — Ambulatory Visit
Admission: RE | Admit: 2019-09-15 | Discharge: 2019-09-15 | Disposition: A | Discharge status: Home / Self Care | Payer: Medicare HMO | Source: Ambulatory Visit | Attending: Cardiovascular Disease | Admitting: Cardiovascular Disease

## 2019-09-15 DIAGNOSIS — R072 Precordial pain: Secondary | ICD-10-CM | POA: Insufficient documentation

## 2019-09-15 MED ORDER — NITROGLYCERIN 0.4 MG SL SUBL
0.8000 mg | SUBLINGUAL_TABLET | Freq: Once | SUBLINGUAL | Status: AC
  Administered 2019-09-15: 0.8 mg via SUBLINGUAL

## 2019-09-15 MED ORDER — METOPROLOL TARTRATE 5 MG/5ML IV SOLN
10.0000 mg | Freq: Once | INTRAVENOUS | Status: AC
  Administered 2019-09-15: 10 mg via INTRAVENOUS

## 2019-09-15 MED ORDER — METOPROLOL TARTRATE 5 MG/5ML IV SOLN
5.0000 mg | Freq: Once | INTRAVENOUS | Status: AC
  Administered 2019-09-15: 5 mg via INTRAVENOUS

## 2019-09-15 MED ORDER — IOHEXOL 350 MG/ML SOLN
75.0000 mL | Freq: Once | INTRAVENOUS | Status: AC | PRN
  Administered 2019-09-15: 75 mL via INTRAVENOUS

## 2019-09-15 NOTE — Progress Notes (Signed)
Patient tolerated CT well. Drank water after. Ambulated to exit steady gait.  

## 2019-09-16 ENCOUNTER — Telehealth: Payer: Self-pay

## 2019-09-16 NOTE — Telephone Encounter (Signed)
-----   Message from Wellington Hampshire, MD sent at 09/16/2019 11:21 AM EDT ----- Inform patient that cardiac CTA was normal with no evidence of atherosclerosis. This is excellent news.

## 2019-09-16 NOTE — Telephone Encounter (Signed)
Call to patient to review cardiac CT results.    Pt verbalized understanding and has no further questions at this time.    Advised pt to call for any further questions or concerns.  No further orders.

## 2020-03-07 ENCOUNTER — Ambulatory Visit (INDEPENDENT_AMBULATORY_CARE_PROVIDER_SITE_OTHER): Payer: Medicare HMO | Admitting: Internal Medicine

## 2020-03-07 ENCOUNTER — Other Ambulatory Visit: Payer: Self-pay

## 2020-03-07 VITALS — BP 112/68 | HR 86 | Temp 98.1°F | Resp 16 | Ht 63.0 in | Wt 119.2 lb

## 2020-03-07 DIAGNOSIS — Z1231 Encounter for screening mammogram for malignant neoplasm of breast: Secondary | ICD-10-CM

## 2020-03-07 DIAGNOSIS — E7800 Pure hypercholesterolemia, unspecified: Secondary | ICD-10-CM

## 2020-03-07 DIAGNOSIS — N898 Other specified noninflammatory disorders of vagina: Secondary | ICD-10-CM

## 2020-03-07 DIAGNOSIS — R079 Chest pain, unspecified: Secondary | ICD-10-CM | POA: Diagnosis not present

## 2020-03-07 DIAGNOSIS — Z23 Encounter for immunization: Secondary | ICD-10-CM

## 2020-03-07 DIAGNOSIS — Z0000 Encounter for general adult medical examination without abnormal findings: Secondary | ICD-10-CM

## 2020-03-07 LAB — CBC WITH DIFFERENTIAL/PLATELET
Basophils Absolute: 0 10*3/uL (ref 0.0–0.1)
Basophils Relative: 0.9 % (ref 0.0–3.0)
Eosinophils Absolute: 0.1 10*3/uL (ref 0.0–0.7)
Eosinophils Relative: 1.2 % (ref 0.0–5.0)
HCT: 37.5 % (ref 36.0–46.0)
Hemoglobin: 12.6 g/dL (ref 12.0–15.0)
Lymphocytes Relative: 24.7 % (ref 12.0–46.0)
Lymphs Abs: 1.2 10*3/uL (ref 0.7–4.0)
MCHC: 33.6 g/dL (ref 30.0–36.0)
MCV: 92.7 fl (ref 78.0–100.0)
Monocytes Absolute: 0.6 10*3/uL (ref 0.1–1.0)
Monocytes Relative: 12 % (ref 3.0–12.0)
Neutro Abs: 2.9 10*3/uL (ref 1.4–7.7)
Neutrophils Relative %: 61.2 % (ref 43.0–77.0)
Platelets: 270 10*3/uL (ref 150.0–400.0)
RBC: 4.05 Mil/uL (ref 3.87–5.11)
RDW: 12.1 % (ref 11.5–15.5)
WBC: 4.8 10*3/uL (ref 4.0–10.5)

## 2020-03-07 LAB — LIPID PANEL
Cholesterol: 251 mg/dL — ABNORMAL HIGH (ref 0–200)
HDL: 86.8 mg/dL (ref >39.00)
LDL Cholesterol: 152 mg/dL — ABNORMAL HIGH (ref 0–99)
NonHDL: 163.86
Total CHOL/HDL Ratio: 3
Triglycerides: 57 mg/dL (ref 0.0–149.0)
VLDL: 11.4 mg/dL (ref 0.0–40.0)

## 2020-03-07 LAB — COMPREHENSIVE METABOLIC PANEL
ALT: 17 U/L (ref 0–35)
AST: 23 U/L (ref 0–37)
Albumin: 4.4 g/dL (ref 3.5–5.2)
Alkaline Phosphatase: 59 U/L (ref 39–117)
BUN: 19 mg/dL (ref 6–23)
CO2: 31 mEq/L (ref 19–32)
Calcium: 9.3 mg/dL (ref 8.4–10.5)
Chloride: 104 mEq/L (ref 96–112)
Creatinine, Ser: 0.67 mg/dL (ref 0.40–1.20)
GFR: 91.49 mL/min (ref >60.00)
Glucose, Bld: 84 mg/dL (ref 70–99)
Potassium: 4.4 mEq/L (ref 3.5–5.1)
Sodium: 140 mEq/L (ref 135–145)
Total Bilirubin: 1.2 mg/dL (ref 0.2–1.2)
Total Protein: 6.7 g/dL (ref 6.0–8.3)

## 2020-03-07 LAB — TSH: TSH: 1.94 u[IU]/mL (ref 0.35–4.50)

## 2020-03-07 NOTE — Assessment & Plan Note (Signed)
Physical today 03/07/20.  PAP 01/10/19 - negative with negative HPV.  Colonoscopy 06/24/17 - diverticulosis.  Recommended f/u in 10 years.

## 2020-03-07 NOTE — Progress Notes (Signed)
Patient ID: Lisa Chen, female   DOB: April 12, 1956, 64 y.o.   MRN: [id number]   Subjective:    Patient ID: Lisa Chen, female    DOB: [birth year], 64 y.o.   MRN: [id number]  HPI This visit occurred during the SARS-CoV-2 public health emergency.  Safety protocols were in place, including screening questions prior to the visit, additional usage of staff PPE, and extensive cleaning of exam room while observing appropriate contact time as indicated for disinfecting solutions.  Patient here for her physical exam.  She reports she is doing relatively well.  Having problems with hot flashes and vaginal dryness - pain with intercourse.  Not sleeping as well.  Discussed treatment options including lubricants and estrogen vaginal cream.  Saw Dr Fletcher Anon for evaluation of chest pain. Calcium score - 0.  No chest pain or sob reported today.  No abdominal pain or bowel change currently.    Past Medical History:  Diagnosis Date  . Cancer ([place])    melanoma  . Headache    3/WEEK, / DUE TO EYELIDS  . Osteopenia    NOT PROGRESSED IN LAST 10 YEARS  . Ptosis, both eyelids    UPPER   Past Surgical History:  Procedure Laterality Date  . ABDOMINAL HYSTERECTOMY  1995  . AUGMENTATION MAMMAPLASTY Bilateral 2000   SALINE  . BREAST EXCISIONAL BIOPSY Right 1990's   NEG  . BREAST SURGERY Right    BIOPSY  . BROW LIFT Bilateral 04/03/2015   Procedure: BLEPHAROPLASTY;  Surgeon: Karle Starch, MD;  Location: [facility];  Service: Ophthalmology;  Laterality: Bilateral;  . [place] SURGERY  2001  . COLONOSCOPY WITH PROPOFOL N/A 06/24/2017   Procedure: COLONOSCOPY WITH PROPOFOL;  Surgeon: Robert Bellow, MD;  Location: [facility];  Service: Endoscopy;  Laterality: N/A;  . COSMETIC SURGERY     SKIN CANCER-MELANOMA ON BACK REMOVED-BASAL CELL ON CHEST ALSO  . EYE SURGERY Bilateral 2000 AND 2002   CATARACTS  . FOOT SURGERY    . HERNIA REPAIR  1984  . PTOSIS REPAIR Bilateral 04/03/2015    Procedure: PTOSIS REPAIR;  Surgeon: Karle Starch, MD;  Location: [facility];  Service: Ophthalmology;  Laterality: Bilateral;  . TONSILLECTOMY     Family History  Problem Relation Age of Onset  . Arthritis Mother   . Heart disease Mother   . Diabetes Mother   . Heart attack Father   . Heart disease Maternal Grandmother   . Heart disease Maternal Grandfather   . Diabetes Maternal Grandfather   . Colon cancer Cousin   . Colon cancer Maternal Uncle   . Heart attack Maternal Uncle   . Stroke Maternal Uncle   . Breast cancer Neg Hx    Social History   Socioeconomic History  . Marital status: Married    Spouse name: Not on file  . Number of children: Not on file  . Years of education: Not on file  . Highest education level: Not on file  Occupational History  . Not on file  Tobacco Use  . Smoking status: Never Smoker  . Smokeless tobacco: Never Used  Vaping Use  . Vaping Use: Never used  Substance and Sexual Activity  . Alcohol use: Yes    Alcohol/week: 3.0 standard drinks    Types: 3 Glasses of wine per week    Comment: OCCAS. none last 24hrs  . Drug use: No  . Sexual activity: Not on file  Other Topics Concern  .  Not on file  Social History Narrative  . Not on file   Social Determinants of Health   Financial Resource Strain:   . Difficulty of Paying Living Expenses: Not on file  Food Insecurity:   . Worried About Charity fundraiser in the Last Year: Not on file  . Ran Out of Food in the Last Year: Not on file  Transportation Needs:   . Lack of Transportation (Medical): Not on file  . Lack of Transportation (Non-Medical): Not on file  Physical Activity:   . Days of Exercise per Week: Not on file  . Minutes of Exercise per Session: Not on file  Stress:   . Feeling of Stress : Not on file  Social Connections:   . Frequency of Communication with Friends and Family: Not on file  . Frequency of Social Gatherings with Friends and Family: Not on file  .  Attends Religious Services: Not on file  . Active Member of Clubs or Organizations: Not on file  . Attends Archivist Meetings: Not on file  . Marital Status: Not on file    Outpatient Encounter Medications as of 03/07/2020  Medication Sig  . metoprolol tartrate (LOPRESSOR) 100 MG tablet Take 1 tablet (100 mg) by mouth 2 hours prior to Cardiac CTA   No facility-administered encounter medications on file as of 03/07/2020.    Review of Systems  Constitutional: Negative for appetite change and unexpected weight change.  HENT: Negative for congestion, sinus pressure and sore throat.   Eyes: Negative for pain and visual disturbance.  Respiratory: Negative for cough, chest tightness and shortness of breath.   Cardiovascular: Negative for chest pain, palpitations and leg swelling.  Gastrointestinal: Negative for abdominal pain, diarrhea, nausea and vomiting.  Genitourinary: Negative for difficulty urinating and dysuria.  Musculoskeletal: Negative for back pain and joint swelling.  Skin: Negative for color change and rash.  Neurological: Negative for dizziness, light-headedness and headaches.  Hematological: Negative for adenopathy. Does not bruise/bleed easily.  Psychiatric/Behavioral: Negative for agitation and dysphoric mood.       Objective:    Physical Exam Vitals reviewed.  Constitutional:      General: She is not in acute distress.    Appearance: Normal appearance. She is well-developed.  HENT:     Head: Normocephalic and atraumatic.     Right Ear: External ear normal.     Left Ear: External ear normal.  Eyes:     General: No scleral icterus.       Right eye: No discharge.        Left eye: No discharge.     Conjunctiva/sclera: Conjunctivae normal.  Neck:     Thyroid: No thyromegaly.  Cardiovascular:     Rate and Rhythm: Normal rate and regular rhythm.  Pulmonary:     Effort: No tachypnea, accessory muscle usage or respiratory distress.     Breath sounds:  Normal breath sounds. No decreased breath sounds or wheezing.  Chest:     Breasts:        Right: No inverted nipple, mass, nipple discharge or tenderness (no axillary adenopathy).        Left: No inverted nipple, mass, nipple discharge or tenderness (no axilarry adenopathy).  Abdominal:     General: Bowel sounds are normal.     Palpations: Abdomen is soft.     Tenderness: There is no abdominal tenderness.  Musculoskeletal:        General: No swelling or tenderness.  Cervical back: Neck supple. No tenderness.  Lymphadenopathy:     Cervical: No cervical adenopathy.  Skin:    Findings: No erythema or rash.  Neurological:     Mental Status: She is alert and oriented to person, place, and time.  Psychiatric:        Mood and Affect: Mood normal.        Behavior: Behavior normal.     BP 112/68   Pulse 86   Temp 98.1 F (36.7 C) (Oral)   Resp 16   Ht 5\' 3"  (1.6 m)   Wt 119 lb 3.2 oz (54.1 kg)   SpO2 99%   BMI 21.12 kg/m  Wt Readings from Last 3 Encounters:  03/07/20 119 lb 3.2 oz (54.1 kg)  08/25/19 119 lb 8 oz (54.2 kg)  01/10/19 124 lb (56.2 kg)     Lab Results  Component Value Date   WBC 4.8 03/07/2020   HGB 12.6 03/07/2020   HCT 37.5 03/07/2020   PLT 270.0 03/07/2020   GLUCOSE 84 03/07/2020   CHOL 251 (H) 03/07/2020   TRIG 57.0 03/07/2020   HDL 86.80 03/07/2020   LDLCALC 152 (H) 03/07/2020   ALT 17 03/07/2020   AST 23 03/07/2020   NA 140 03/07/2020   K 4.4 03/07/2020   CL 104 03/07/2020   CREATININE 0.67 03/07/2020   BUN 19 03/07/2020   CO2 31 03/07/2020   TSH 1.94 03/07/2020    CT CORONARY MORPH W/CTA COR W/SCORE W/CA W/CM &/OR WO/CM  Addendum Date: 09/15/2019   ADDENDUM REPORT: 09/15/2019 16:09 EXAM: OVER-READ INTERPRETATION  CT CHEST The following report is an over-read performed by radiologist Dr. Samara Snide [facility] on 09/15/2019. This over-read does not include interpretation of cardiac or coronary anatomy or pathology. The  coronary CTA interpretation by the cardiologist is attached. COMPARISON:  None. FINDINGS: Cardiovascular: Normal heart size. No significant pericardial effusion/thickening. Great vessels are normal in course and caliber. No central pulmonary emboli. Mediastinum/Nodes: Unremarkable esophagus. No pathologically enlarged mediastinal or hilar lymph nodes. Lungs/Pleura: No pneumothorax. No pleural effusion. No acute consolidative airspace disease, lung masses or significant pulmonary nodules. Upper abdomen: No acute abnormality. Musculoskeletal: No aggressive appearing focal osseous lesions. Bilateral breast prostheses noted. IMPRESSION: No significant extracardiac findings. Electronically Signed   By: Ilona Sorrel M.D.   On: 09/15/2019 16:09   Result Date: 09/15/2019 CLINICAL DATA:  Chestpain and shortness of breath EXAM: Cardiac/Coronary  CTA TECHNIQUE: The patient was scanned on a Siemens Somatoform go.Top scanner. FINDINGS: A retrospective scan was triggered in the descending thoracic aorta. Axial non-contrast 3 mm slices were carried out through the heart. The data set was analyzed on a dedicated work station and scored using the [place]. Gantry rotation speed was 330 msecs and collimation was .6 mm. 100mg  of metoprolol, 15mg  iv and 0.8 mg of sl NTG was given. The 3D data set was reconstructed in 5% intervals of the 45-95 % of the R-R cycle. Diastolic phases were analyzed on a dedicated work station using MPR, MIP and VRT modes. The patient received 75 cc of contrast. Aorta:  Normal size.  No calcifications.  No dissection. Aortic Valve:  Trileaflet.  No calcifications. Coronary Arteries:  Normal coronary origin.  Left dominance. RCA is a small nondominant artery that gives rise to a small RV branch. There is no plaque. Left main is a large artery that gives rise to LAD and LCX arteries. LAD is a large vessel that has no plaque. LCX is  a dominant artery that gives rise to a large OM1 and smaller OM2 branch  before supplying the PDA. There is no plaque. Other findings: Normal pulmonary vein drainage into the left atrium. Normal left atrial appendage without a thrombus. Normal size of the pulmonary artery. IMPRESSION: 1. Coronary calcium score of 0. Patient is low risk for near term coronary events 2. Normal coronary origin with left dominance. 3. No evidence of CAD. 4. CAD-RADS 0. Consider non-atherosclerotic causes of chest pain. Electronically Signed: By: Kate Sable M.D. On: 09/15/2019 15:30       Assessment & Plan:   Problem List Items Addressed This Visit    Vaginal dryness    Discussed treatment options.  Discussed lubricants and estrogen cream. Wants to try lubricants.   Follow.        Hypercholesterolemia    Calcium score 0.  Low cholesterol diet and exercise.  Follow lipid panel.       Relevant Orders   CBC with Differential/Platelet (Completed)   Comprehensive metabolic panel (Completed)   Lipid panel (Completed)   TSH (Completed)   Health care maintenance    Physical today 03/07/20.  PAP 01/10/19 - negative with negative HPV.  Colonoscopy 06/24/17 - diverticulosis.  Recommended f/u in 10 years.        Chest pain    No chest pain now.  Calcium score 0.  Evaluated by cardiology.  Follow.        Other Visit Diagnoses    Visit for screening mammogram    -  Primary   Relevant Orders   MM 3D SCREEN BREAST W/IMPLANT BILATERAL   Need for immunization against influenza       Relevant Orders   Flu Vaccine QUAD 36+ mos IM (Completed)       Einar Pheasant, MD

## 2020-03-10 ENCOUNTER — Encounter: Payer: Self-pay | Admitting: Internal Medicine

## 2020-03-10 DIAGNOSIS — N898 Other specified noninflammatory disorders of vagina: Secondary | ICD-10-CM | POA: Insufficient documentation

## 2020-03-10 NOTE — Assessment & Plan Note (Signed)
Calcium score 0.  Low cholesterol diet and exercise.  Follow lipid panel.

## 2020-03-10 NOTE — Assessment & Plan Note (Signed)
Discussed treatment options.  Discussed lubricants and estrogen cream. Wants to try lubricants.   Follow.

## 2020-03-10 NOTE — Assessment & Plan Note (Signed)
No chest pain now.  Calcium score 0.  Evaluated by cardiology.  Follow.

## 2020-03-20 DIAGNOSIS — R69 Illness, unspecified: Secondary | ICD-10-CM | POA: Diagnosis not present

## 2020-04-13 ENCOUNTER — Other Ambulatory Visit: Payer: Self-pay

## 2020-04-13 ENCOUNTER — Ambulatory Visit
Admission: RE | Admit: 2020-04-13 | Discharge: 2020-04-13 | Disposition: A | Discharge status: Home / Self Care | Payer: Medicare HMO | Source: Ambulatory Visit | Attending: Internal Medicine | Admitting: Internal Medicine

## 2020-04-13 DIAGNOSIS — Z1231 Encounter for screening mammogram for malignant neoplasm of breast: Secondary | ICD-10-CM | POA: Diagnosis not present

## 2020-06-27 ENCOUNTER — Telehealth: Payer: Self-pay | Admitting: Internal Medicine

## 2020-06-27 NOTE — Telephone Encounter (Signed)
Left message for patient to call back and schedule Medicare Annual Wellness Visit (AWV)   This should be a virtual visit only=30 minutes.  No hx of AWV; please schedule at anytime with Denisa O'Brien-Blaney at [facility]  AWV-I PER [place] AS OF 05/29/2020

## 2020-07-03 ENCOUNTER — Ambulatory Visit (INDEPENDENT_AMBULATORY_CARE_PROVIDER_SITE_OTHER): Payer: Medicare HMO

## 2020-07-03 VITALS — Ht 63.0 in | Wt 117.0 lb

## 2020-07-03 DIAGNOSIS — Z0000 Encounter for general adult medical examination without abnormal findings: Secondary | ICD-10-CM

## 2020-07-03 NOTE — Patient Instructions (Addendum)
Lisa Chen , Thank you for taking time to come for your Medicare Wellness Visit. I appreciate your ongoing commitment to your health goals. Please review the following plan we discussed and let me know if I can assist you in the future.   These are the goals we discussed: Goals      Patient Stated   .  Maintain Healthy Lifestyle (pt-stated)      Stay active Healthy diet       This is a list of the screening recommended for you and due dates:  Health Maintenance  Topic Date Due  .  Hepatitis C: One time screening is recommended by Center for Disease Control  (CDC) for  adults born from [birth year] through 1966.   Never done  . Tetanus Vaccine  07/03/2021*  . Pneumonia vaccines (1 of 2 - PCV13) 07/03/2021*  . Mammogram  04/13/2022  . Colon Cancer Screening  06/25/2027  . Flu Shot  Completed  . DEXA scan (bone density measurement)  Completed  . COVID-19 Vaccine  Completed  . HPV Vaccine  Aged Out  *Topic was postponed. The date shown is not the original due date.     Immunizations Immunization History  Administered Date(s) Administered  . Influenza,inj,Quad PF,6+ Mos 12/26/2016, 12/30/2017, 01/10/2019, 03/07/2020  . PFIZER Comirnaty(Gray Top)Covid-19 Tri-Sucrose Vaccine 06/03/2019, 06/24/2019, 04/23/2020   Keep all routine maintenance appointments.   Follow up 09/04/20 @ 9:00  Advanced directives: End of life planning; Advance aging; Advanced directives discussed.  Copy of current HCPOA/Living Will requested.    Conditions/risks identified: none new.  Follow up in one year for your annual wellness visit.   Preventive Care 29 Years and Older, Female Preventive care refers to lifestyle choices and visits with your health care provider that can promote health and wellness. What does preventive care include?  A yearly physical exam. This is also called an annual well check.  Dental exams once or twice a year.  Routine eye exams. Ask your health care provider how often you  should have your eyes checked.  Personal lifestyle choices, including:  Daily care of your teeth and gums.  Regular physical activity.  Eating a healthy diet.  Avoiding tobacco and drug use.  Limiting alcohol use.  Practicing safe sex.  Taking low-dose aspirin every day.  Taking vitamin and mineral supplements as recommended by your health care provider. What happens during an annual well check? The services and screenings done by your health care provider during your annual well check will depend on your age, overall health, lifestyle risk factors, and family history of disease. Counseling  Your health care provider may ask you questions about your:  Alcohol use.  Tobacco use.  Drug use.  Emotional well-being.  Home and relationship well-being.  Sexual activity.  Eating habits.  History of falls.  Memory and ability to understand (cognition).  Work and work Statistician.  Reproductive health. Screening  You may have the following tests or measurements:  Height, weight, and BMI.  Blood pressure.  Lipid and cholesterol levels. These may be checked every 5 years, or more frequently if you are over 56 years old.  Skin check.  Lung cancer screening. You may have this screening every year starting at age 13 if you have a 30-pack-year history of smoking and currently smoke or have quit within the past 15 years.  Fecal occult blood test (FOBT) of the stool. You may have this test every year starting at age 39.  Flexible sigmoidoscopy or  colonoscopy. You may have a sigmoidoscopy every 5 years or a colonoscopy every 10 years starting at age 8.  Hepatitis C blood test.  Hepatitis B blood test.  Sexually transmitted disease (STD) testing.  Diabetes screening. This is done by checking your blood sugar (glucose) after you have not eaten for a while (fasting). You may have this done every 1-3 years.  Bone density scan. This is done to screen for osteoporosis.  You may have this done starting at age 83.  Mammogram. This may be done every 1-2 years. Talk to your health care provider about how often you should have regular mammograms. Talk with your health care provider about your test results, treatment options, and if necessary, the need for more tests. Vaccines  Your health care provider may recommend certain vaccines, such as:  Influenza vaccine. This is recommended every year.  Tetanus, diphtheria, and acellular pertussis (Tdap, Td) vaccine. You may need a Td booster every 10 years.  Zoster vaccine. You may need this after age 18.  Pneumococcal 13-valent conjugate (PCV13) vaccine. One dose is recommended after age 78.  Pneumococcal polysaccharide (PPSV23) vaccine. One dose is recommended after age 20. Talk to your health care provider about which screenings and vaccines you need and how often you need them. This information is not intended to replace advice given to you by your health care provider. Make sure you discuss any questions you have with your health care provider. Document Released: 05/11/2015 Document Revised: 01/02/2016 Document Reviewed: 02/13/2015 Elsevier Interactive Patient Education  2017 [place] Prevention in the Home Falls can cause injuries. They can happen to people of all ages. There are many things you can do to make your home safe and to help prevent falls. What can I do on the outside of my home?  Regularly fix the edges of walkways and driveways and fix any cracks.  Remove anything that might make you trip as you walk through a door, such as a raised step or threshold.  Trim any bushes or trees on the path to your home.  Use bright outdoor lighting.  Clear any walking paths of anything that might make someone trip, such as rocks or tools.  Regularly check to see if handrails are loose or broken. Make sure that both sides of any steps have handrails.  Any raised decks and porches should have  guardrails on the edges.  Have any leaves, snow, or ice cleared regularly.  Use sand or salt on walking paths during winter.  Clean up any spills in your garage right away. This includes oil or grease spills. What can I do in the bathroom?  Use night lights.  Install grab bars by the toilet and in the tub and shower. Do not use towel bars as grab bars.  Use non-skid mats or decals in the tub or shower.  If you need to sit down in the shower, use a plastic, non-slip stool.  Keep the floor dry. Clean up any water that spills on the floor as soon as it happens.  Remove soap buildup in the tub or shower regularly.  Attach bath mats securely with double-sided non-slip rug tape.  Do not have throw rugs and other things on the floor that can make you trip. What can I do in the bedroom?  Use night lights.  Make sure that you have a light by your bed that is easy to reach.  Do not use any sheets or blankets that are too  big for your bed. They should not hang down onto the floor.  Have a firm chair that has side arms. You can use this for support while you get dressed.  Do not have throw rugs and other things on the floor that can make you trip. What can I do in the kitchen?  Clean up any spills right away.  Avoid walking on wet floors.  Keep items that you use a lot in easy-to-reach places.  If you need to reach something above you, use a strong step stool that has a grab bar.  Keep electrical cords out of the way.  Do not use floor polish or wax that makes floors slippery. If you must use wax, use non-skid floor wax.  Do not have throw rugs and other things on the floor that can make you trip. What can I do with my stairs?  Do not leave any items on the stairs.  Make sure that there are handrails on both sides of the stairs and use them. Fix handrails that are broken or loose. Make sure that handrails are as long as the stairways.  Check any carpeting to make sure that  it is firmly attached to the stairs. Fix any carpet that is loose or worn.  Avoid having throw rugs at the top or bottom of the stairs. If you do have throw rugs, attach them to the floor with carpet tape.  Make sure that you have a light switch at the top of the stairs and the bottom of the stairs. If you do not have them, ask someone to add them for you. What else can I do to help prevent falls?  Wear shoes that:  Do not have high heels.  Have rubber bottoms.  Are comfortable and fit you well.  Are closed at the toe. Do not wear sandals.  If you use a stepladder:  Make sure that it is fully opened. Do not climb a closed stepladder.  Make sure that both sides of the stepladder are locked into place.  Ask someone to hold it for you, if possible.  Clearly mark and make sure that you can see:  Any grab bars or handrails.  First and last steps.  Where the edge of each step is.  Use tools that help you move around (mobility aids) if they are needed. These include:  Canes.  Walkers.  Scooters.  Crutches.  Turn on the lights when you go into a dark area. Replace any light bulbs as soon as they burn out.  Set up your furniture so you have a clear path. Avoid moving your furniture around.  If any of your floors are uneven, fix them.  If there are any pets around you, be aware of where they are.  Review your medicines with your doctor. Some medicines can make you feel dizzy. This can increase your chance of falling. Ask your doctor what other things that you can do to help prevent falls. This information is not intended to replace advice given to you by your health care provider. Make sure you discuss any questions you have with your health care provider. Document Released: 02/08/2009 Document Revised: 09/20/2015 Document Reviewed: 05/19/2014 Elsevier Interactive Patient Education  2017 [employer].

## 2020-07-03 NOTE — Progress Notes (Signed)
Subjective:   Lisa Chen is a 65 y.o. female who presents for an Initial Medicare Annual Wellness Visit.  Review of Systems    No ROS.  Medicare Wellness Virtual Visit.     Cardiac Risk Factors include: advanced age (>51men, >67 women);hypertension     Objective:    Today's Vitals   07/03/20 1340  Weight: 117 lb (53.1 kg)  Height: 5\' 3"  (1.6 m)   Body mass index is 20.73 kg/m.  Advanced Directives 06/24/2017 04/03/2015  Does Patient Have a Medical Advance Directive? Yes Yes  Type of Paramedic of [place];Living will [place];Living will  Does patient want to make changes to medical advance directive? - No - Patient declined  Copy of [place] in Chart? Yes No - copy requested    Current Medications (verified) No outpatient encounter medications on file as of 07/03/2020.   No facility-administered encounter medications on file as of 07/03/2020.    Allergies (verified) Erythromycin and Other   History: Past Medical History:  Diagnosis Date  . Cancer ([place])    melanoma  . Headache    3/WEEK, / DUE TO EYELIDS  . Osteopenia    NOT PROGRESSED IN LAST 10 YEARS  . Ptosis, both eyelids    UPPER   Past Surgical History:  Procedure Laterality Date  . ABDOMINAL HYSTERECTOMY  1995  . AUGMENTATION MAMMAPLASTY Bilateral 2000   SALINE  . BREAST EXCISIONAL BIOPSY Right 1990's   NEG  . BREAST SURGERY Right    BIOPSY  . BROW LIFT Bilateral 04/03/2015   Procedure: BLEPHAROPLASTY;  Surgeon: Karle Starch, MD;  Location: [facility];  Service: Ophthalmology;  Laterality: Bilateral;  . [place] SURGERY  2001  . COLONOSCOPY WITH PROPOFOL N/A 06/24/2017   Procedure: COLONOSCOPY WITH PROPOFOL;  Surgeon: Robert Bellow, MD;  Location: [facility] ENDOSCOPY;  Service: Endoscopy;  Laterality: N/A;  . COSMETIC SURGERY     SKIN CANCER-MELANOMA ON BACK REMOVED-BASAL CELL ON CHEST ALSO  . EYE SURGERY Bilateral  2000 AND 2002   CATARACTS  . FOOT SURGERY    . HERNIA REPAIR  1984  . PTOSIS REPAIR Bilateral 04/03/2015   Procedure: PTOSIS REPAIR;  Surgeon: Karle Starch, MD;  Location: [facility];  Service: Ophthalmology;  Laterality: Bilateral;  . TONSILLECTOMY     Family History  Problem Relation Age of Onset  . Arthritis Mother   . Heart disease Mother   . Diabetes Mother   . Heart attack Father   . Heart disease Maternal Grandmother   . Heart disease Maternal Grandfather   . Diabetes Maternal Grandfather   . Colon cancer Cousin   . Colon cancer Maternal Uncle   . Heart attack Maternal Uncle   . Stroke Maternal Uncle   . Breast cancer Neg Hx    Social History   Socioeconomic History  . Marital status: Married    Spouse name: Not on file  . Number of children: Not on file  . Years of education: Not on file  . Highest education level: Not on file  Occupational History  . Not on file  Tobacco Use  . Smoking status: Never Smoker  . Smokeless tobacco: Never Used  Vaping Use  . Vaping Use: Never used  Substance and Sexual Activity  . Alcohol use: Yes    Alcohol/week: 3.0 standard drinks    Types: 3 Glasses of wine per week    Comment: OCCAS. none  last 24hrs  . Drug use: No  . Sexual activity: Not on file  Other Topics Concern  . Not on file  Social History Narrative  . Not on file   Social Determinants of Health   Financial Resource Strain: Low Risk   . Difficulty of Paying Living Expenses: Not hard at all  Food Insecurity: No Food Insecurity  . Worried About Charity fundraiser in the Last Year: Never true  . Ran Out of Food in the Last Year: Never true  Transportation Needs: No Transportation Needs  . Lack of Transportation (Medical): No  . Lack of Transportation (Non-Medical): No  Physical Activity: Sufficiently Active  . Days of Exercise per Week: 5 days  . Minutes of Exercise per Session: 30 min  Stress: No Stress Concern Present  . Feeling of Stress : Not  at all  Social Connections: Socially Integrated  . Frequency of Communication with Friends and Family: More than three times a week  . Frequency of Social Gatherings with Friends and Family: More than three times a week  . Attends Religious Services: More than 4 times per year  . Active Member of Clubs or Organizations: Yes  . Attends Archivist Meetings: Not on file  . Marital Status: Married    Tobacco Counseling Counseling given: Not Answered   Clinical Intake:  Pre-visit preparation completed: Yes        Diabetes: No  How often do you need to have someone help you when you read instructions, pamphlets, or other written materials from your doctor or pharmacy?: 1 - Never     Interpreter Needed?: No      Activities of Daily Living In your present state of health, do you have any difficulty performing the following activities: 07/03/2020  Hearing? N  Vision? N  Difficulty concentrating or making decisions? N  Walking or climbing stairs? N  Dressing or bathing? N  Doing errands, shopping? N  Preparing Food and eating ? N  Using the Toilet? N  In the past six months, have you accidently leaked urine? N  Do you have problems with loss of bowel control? N  Managing your Medications? N  Managing your Finances? N  Housekeeping or managing your Housekeeping? N  Some recent data might be hidden    Patient Care Team: Einar Pheasant, MD as PCP - General (Internal Medicine) Einar Pheasant, MD (Internal Medicine) Bary Castilla Forest Gleason, MD (General Surgery)  Indicate any recent Medical Services you may have received from other than [facility] providers in the past year (date may be approximate).     Assessment:   This is a routine wellness examination for [place]yx.  I connected with Bevan today by telephone and verified that I am speaking with the correct person using two identifiers. Location patient: home Location provider: work Persons participating in the  virtual visit: patient, Marine scientist.    I discussed the limitations, risks, security and privacy concerns of performing an evaluation and management service by telephone and the availability of in person appointments. The patient expressed understanding and verbally consented to this telephonic visit.    Interactive audio and video telecommunications were attempted between this provider and patient, however failed, due to patient having technical difficulties OR patient did not have access to video capability.  We continued and completed visit with audio only.  Some vital signs may be absent or patient reported.   Hearing/Vision screen  Hearing Screening   125Hz  250Hz  500Hz  1000Hz  2000Hz  3000Hz  4000Hz   6000Hz  8000Hz   Right ear:           Left ear:           Comments: Patient is able to hear conversational tones without difficulty.  No issues reported.  Vision Screening Comments: Visual acuity not assessed, virtual visit.       Dietary issues and exercise activities discussed: Current Exercise Habits: Home exercise routine, Time (Minutes): 30, Frequency (Times/Week): 5, Weekly Exercise (Minutes/Week): 150, Intensity: Mild  Healthy diet Good water intake  Goals      Patient Stated   .  Maintain Healthy Lifestyle (pt-stated)      Stay active Healthy diet      Depression Screen PHQ 2/9 Scores 07/03/2020 03/07/2020 01/10/2019 05/20/2017 12/26/2016 12/26/2016  PHQ - 2 Score 0 0 0 0 0 0  PHQ- 9 Score - - - - 0 -    Fall Risk Fall Risk  07/03/2020 03/07/2020 05/20/2017 12/26/2016  Falls in the past year? 0 0 No No  Number falls in past yr: 0 - - -  Injury with Fall? 0 - - -  Follow up Falls evaluation completed Falls evaluation completed - -    FALL RISK PREVENTION PERTAINING TO THE HOME: Handrails in use when climbing stairs? Yes Home free of loose throw rugs in walkways, pet beds, electrical cords, etc? Yes  Adequate lighting in your home to reduce risk of falls? Yes   ASSISTIVE DEVICES  UTILIZED TO PREVENT FALLS: Use of a cane, walker or w/c? No   TIMED UP AND GO: Was the test performed? No .   Cognitive Function:  Patient is alert and oriented x3.  Denies difficulty focusing, making decisions, memory loss.  Enjoys singing, recall and learning songs. MMSE/6CIT deferred. Normal by direct communication/observation.       Immunizations Immunization History  Administered Date(s) Administered  . Influenza,inj,Quad PF,6+ Mos 12/26/2016, 12/30/2017, 01/10/2019, 03/07/2020  . PFIZER Comirnaty(Gray [place])Covid-19 Tri-Sucrose Vaccine 06/03/2019, 06/24/2019, 04/23/2020    TDAP status: Due, Education has been provided regarding the importance of this vaccine. Advised may receive this vaccine at local pharmacy or Health Dept. Aware to provide a copy of the vaccination record if obtained from local pharmacy or Health Dept. Verbalized acceptance and understanding. Deferred.   Health Maintenance Health Maintenance  Topic Date Due  . Hepatitis C Screening  Never done  . TETANUS/TDAP  07/03/2021 (Originally 05/29/1974)  . PNA vac Low Risk Adult (1 of 2 - PCV13) 07/03/2021 (Originally 05/30/2019)  . MAMMOGRAM  04/13/2022  . COLONOSCOPY (Pts 45-46yrs Insurance coverage will need to be confirmed)  06/25/2027  . INFLUENZA VACCINE  Completed  . DEXA SCAN  Completed  . COVID-19 Vaccine  Completed  . HPV VACCINES  Aged Out   Colorectal cancer screening: Type of screening: Colonoscopy. Completed 06/24/17. Repeat every 10 years  Mammogram status: Completed 04/13/20. Repeat every year  Lung Cancer Screening: (Low Dose CT Chest recommended if Age 92-80 years, 30 pack-year currently smoking OR have quit w/in 15years.) does not qualify.   PNA vaccine- Advised may receive this vaccine in office, at local pharmacy or Health Dept. Aware to provide a copy of the vaccination record if obtained from local pharmacy or Health Dept. Verbalized acceptance and understanding. Deferred.   Vision  Screening: Recommended annual ophthalmology exams for early detection of glaucoma and other disorders of the eye. Is the patient up to date with their annual eye exam?  Yes   Dental Screening: Recommended annual dental exams for proper  oral hygiene.  Community Resource Referral / Chronic Care Management: CRR required this visit?  No   CCM required this visit?  No      Plan:   Keep all routine maintenance appointments.   Follow up 09/04/20 @ 9:00  Hep C screening- thinking about it  I have personally reviewed and noted the following in the patient's chart:   . Medical and social history . Use of alcohol, tobacco or illicit drugs  . Current medications and supplements . Functional ability and status . Nutritional status . Physical activity . Advanced directives . List of other physicians . Hospitalizations, surgeries, and ER visits in previous 12 months . Vitals . Screenings to include cognitive, depression, and falls . Referrals and appointments  In addition, I have reviewed and discussed with patient certain preventive protocols, quality metrics, and best practice recommendations. A written personalized care plan for preventive services as well as general preventive health recommendations were provided to patient via mychart.     Varney Biles, LPN   09/27/8483

## 2020-07-12 DIAGNOSIS — D2262 Melanocytic nevi of left upper limb, including shoulder: Secondary | ICD-10-CM | POA: Diagnosis not present

## 2020-07-12 DIAGNOSIS — X32XXXA Exposure to sunlight, initial encounter: Secondary | ICD-10-CM | POA: Diagnosis not present

## 2020-07-12 DIAGNOSIS — L820 Inflamed seborrheic keratosis: Secondary | ICD-10-CM | POA: Diagnosis not present

## 2020-07-12 DIAGNOSIS — D485 Neoplasm of uncertain behavior of skin: Secondary | ICD-10-CM | POA: Diagnosis not present

## 2020-07-12 DIAGNOSIS — L298 Other pruritus: Secondary | ICD-10-CM | POA: Diagnosis not present

## 2020-07-12 DIAGNOSIS — D2261 Melanocytic nevi of right upper limb, including shoulder: Secondary | ICD-10-CM | POA: Diagnosis not present

## 2020-07-12 DIAGNOSIS — L538 Other specified erythematous conditions: Secondary | ICD-10-CM | POA: Diagnosis not present

## 2020-07-12 DIAGNOSIS — L821 Other seborrheic keratosis: Secondary | ICD-10-CM | POA: Diagnosis not present

## 2020-07-12 DIAGNOSIS — D225 Melanocytic nevi of trunk: Secondary | ICD-10-CM | POA: Diagnosis not present

## 2020-07-12 DIAGNOSIS — L570 Actinic keratosis: Secondary | ICD-10-CM | POA: Diagnosis not present

## 2020-09-04 ENCOUNTER — Encounter: Payer: Self-pay | Admitting: Internal Medicine

## 2020-09-04 ENCOUNTER — Other Ambulatory Visit: Payer: Self-pay

## 2020-09-04 ENCOUNTER — Ambulatory Visit (INDEPENDENT_AMBULATORY_CARE_PROVIDER_SITE_OTHER): Payer: Medicare HMO | Admitting: Internal Medicine

## 2020-09-04 DIAGNOSIS — Z800 Family history of malignant neoplasm of digestive organs: Secondary | ICD-10-CM

## 2020-09-04 DIAGNOSIS — R232 Flushing: Secondary | ICD-10-CM

## 2020-09-04 DIAGNOSIS — E7800 Pure hypercholesterolemia, unspecified: Secondary | ICD-10-CM | POA: Diagnosis not present

## 2020-09-04 NOTE — Patient Instructions (Signed)
Vitamin E 200 IUs - take one twice a day for 400 once a day.

## 2020-09-04 NOTE — Progress Notes (Signed)
Patient ID: Lisa Chen, female   DOB: March 29, 1956, 65 y.o.   MRN: [id number]   Subjective:    Patient ID: Lisa Chen, female    DOB: 04/29/1955, 65 y.o.   MRN: [id number]  HPI This visit occurred during the SARS-CoV-2 public health emergency.  Safety protocols were in place, including screening questions prior to the visit, additional usage of staff PPE, and extensive cleaning of exam room while observing appropriate contact time as indicated for disinfecting solutions.  Patient here for a scheduled follow up.  Here to follow up regarding her cholesterol and hot flashes.  Increased hot flashes.  Previously on HRT.  Since being off, she has continued to have hot flashes.  Wakes her up at night.  Continues to have vaginal dryness.  Did not try lubricants. Discussed treatment options, including - HRT, effexor, vitamin E.  She tries to stay active.  No chest pain or sob with increased activity or exertion.  No acid reflux.  No abdominal pain.  Bowels moving.     Past Medical History:  Diagnosis Date  . Cancer ([place])    melanoma  . Headache    3/WEEK, / DUE TO EYELIDS  . Osteopenia    NOT PROGRESSED IN LAST 10 YEARS  . Ptosis, both eyelids    UPPER   Past Surgical History:  Procedure Laterality Date  . ABDOMINAL HYSTERECTOMY  1995  . AUGMENTATION MAMMAPLASTY Bilateral 2000   SALINE  . BREAST EXCISIONAL BIOPSY Right 1990's   NEG  . BREAST SURGERY Right    BIOPSY  . BROW LIFT Bilateral 04/03/2015   Procedure: BLEPHAROPLASTY;  Surgeon: Karle Starch, MD;  Location: [facility];  Service: Ophthalmology;  Laterality: Bilateral;  . [place] SURGERY  2001  . COLONOSCOPY WITH PROPOFOL N/A 06/24/2017   Procedure: COLONOSCOPY WITH PROPOFOL;  Surgeon: Robert Bellow, MD;  Location: [facility];  Service: Endoscopy;  Laterality: N/A;  . COSMETIC SURGERY     SKIN CANCER-MELANOMA ON BACK REMOVED-BASAL CELL ON CHEST ALSO  . EYE SURGERY Bilateral 2000 AND 2002   CATARACTS   . FOOT SURGERY    . HERNIA REPAIR  1984  . PTOSIS REPAIR Bilateral 04/03/2015   Procedure: PTOSIS REPAIR;  Surgeon: Karle Starch, MD;  Location: [facility];  Service: Ophthalmology;  Laterality: Bilateral;  . TONSILLECTOMY     Family History  Problem Relation Age of Onset  . Arthritis Mother   . Heart disease Mother   . Diabetes Mother   . Heart attack Father   . Heart disease Maternal Grandmother   . Heart disease Maternal Grandfather   . Diabetes Maternal Grandfather   . Colon cancer Cousin   . Colon cancer Maternal Uncle   . Heart attack Maternal Uncle   . Stroke Maternal Uncle   . Breast cancer Neg Hx    Social History   Socioeconomic History  . Marital status: Married    Spouse name: Not on file  . Number of children: Not on file  . Years of education: Not on file  . Highest education level: Not on file  Occupational History  . Not on file  Tobacco Use  . Smoking status: Never Smoker  . Smokeless tobacco: Never Used  Vaping Use  . Vaping Use: Never used  Substance and Sexual Activity  . Alcohol use: Yes    Alcohol/week: 3.0 standard drinks    Types: 3 Glasses of wine per week    Comment: OCCAS.  none last 24hrs  . Drug use: No  . Sexual activity: Not on file  Other Topics Concern  . Not on file  Social History Narrative  . Not on file   Social Determinants of Health   Financial Resource Strain: Low Risk   . Difficulty of Paying Living Expenses: Not hard at all  Food Insecurity: No Food Insecurity  . Worried About Charity fundraiser in the Last Year: Never true  . Ran Out of Food in the Last Year: Never true  Transportation Needs: No Transportation Needs  . Lack of Transportation (Medical): No  . Lack of Transportation (Non-Medical): No  Physical Activity: Sufficiently Active  . Days of Exercise per Week: 5 days  . Minutes of Exercise per Session: 30 min  Stress: No Stress Concern Present  . Feeling of Stress : Not at all  Social  Connections: Socially Integrated  . Frequency of Communication with Friends and Family: More than three times a week  . Frequency of Social Gatherings with Friends and Family: More than three times a week  . Attends Religious Services: More than 4 times per year  . Active Member of Clubs or Organizations: Yes  . Attends Archivist Meetings: Not on file  . Marital Status: Married   No outpatient encounter medications on file as of 09/04/2020.   No facility-administered encounter medications on file as of 09/04/2020.    Review of Systems  Constitutional: Negative for appetite change and unexpected weight change.  HENT: Negative for congestion and sinus pressure.   Respiratory: Negative for cough, chest tightness and shortness of breath.   Cardiovascular: Negative for chest pain, palpitations and leg swelling.  Gastrointestinal: Negative for abdominal pain, diarrhea, nausea and vomiting.  Genitourinary: Negative for difficulty urinating and dysuria.  Musculoskeletal: Negative for joint swelling and myalgias.  Skin: Negative for color change and rash.  Neurological: Negative for dizziness, light-headedness and headaches.  Psychiatric/Behavioral: Negative for agitation and dysphoric mood.       Objective:    Physical Exam Vitals reviewed.  Constitutional:      General: She is not in acute distress.    Appearance: Normal appearance.  HENT:     Head: Normocephalic and atraumatic.     Right Ear: External ear normal.     Left Ear: External ear normal.  Eyes:     General: No scleral icterus.       Right eye: No discharge.        Left eye: No discharge.  Neck:     Thyroid: No thyromegaly.  Cardiovascular:     Rate and Rhythm: Normal rate and regular rhythm.  Pulmonary:     Effort: No respiratory distress.     Breath sounds: Normal breath sounds. No wheezing.  Abdominal:     General: Bowel sounds are normal.     Palpations: Abdomen is soft.     Tenderness: There is no  abdominal tenderness.  Musculoskeletal:        General: No swelling or tenderness.     Cervical back: Neck supple. No tenderness.  Lymphadenopathy:     Cervical: No cervical adenopathy.  Skin:    Findings: No erythema or rash.  Neurological:     Mental Status: She is alert.  Psychiatric:        Mood and Affect: Mood normal.        Behavior: Behavior normal.     BP 110/62   Pulse 83   Temp 97.9 F (  36.6 C) (Oral)   Resp 16   Ht 5\' 3"  (1.6 m)   Wt 119 lb (54 kg)   SpO2 99%   BMI 21.08 kg/m  Wt Readings from Last 3 Encounters:  09/04/20 119 lb (54 kg)  07/03/20 117 lb (53.1 kg)  03/07/20 119 lb 3.2 oz (54.1 kg)     Lab Results  Component Value Date   WBC 4.8 03/07/2020   HGB 12.6 03/07/2020   HCT 37.5 03/07/2020   PLT 270.0 03/07/2020   GLUCOSE 84 03/07/2020   CHOL 251 (H) 03/07/2020   TRIG 57.0 03/07/2020   HDL 86.80 03/07/2020   LDLCALC 152 (H) 03/07/2020   ALT 17 03/07/2020   AST 23 03/07/2020   NA 140 03/07/2020   K 4.4 03/07/2020   CL 104 03/07/2020   CREATININE 0.67 03/07/2020   BUN 19 03/07/2020   CO2 31 03/07/2020   TSH 1.94 03/07/2020    MM 3D SCREEN BREAST W/IMPLANT BILATERAL  Result Date: 04/17/2020 CLINICAL DATA:  Screening. EXAM: DIGITAL SCREENING BILATERAL MAMMOGRAM WITH IMPLANTS, CAD AND TOMO The patient has retropectoral implants. Standard and implant displaced views were performed. COMPARISON:  Previous exam(s). ACR Breast Density Category c: The breast tissue is heterogeneously dense, which may obscure small masses. FINDINGS: There are no findings suspicious for malignancy. Images were processed with CAD. IMPRESSION: No mammographic evidence of malignancy. A result letter of this screening mammogram will be mailed directly to the patient. RECOMMENDATION: Screening mammogram in one year. (Code:SM-B-01Y) BI-RADS CATEGORY  1:  Negative. Electronically Signed   By: Claudie Revering M.D.   On: 04/17/2020 10:22       Assessment & Plan:   Problem  List Items Addressed This Visit    Family history of colon cancer    Colonoscopy 05/2017.  Per note, due f/u 10 years.  Will need to clarify given family history.       Hot flashes    Describes intermittent hot flashes.  Persistent.  Discussed treatment options.  Off estrogen.        Hypercholesterolemia    calcium score 0.  Low cholesterol diet and exercise.  Follow lipid panel.       Relevant Orders   Comprehensive metabolic panel   Lipid panel       Einar Pheasant, MD

## 2020-09-12 DIAGNOSIS — D485 Neoplasm of uncertain behavior of skin: Secondary | ICD-10-CM | POA: Diagnosis not present

## 2020-09-12 DIAGNOSIS — C44311 Basal cell carcinoma of skin of nose: Secondary | ICD-10-CM | POA: Diagnosis not present

## 2020-09-15 DIAGNOSIS — R232 Flushing: Secondary | ICD-10-CM | POA: Insufficient documentation

## 2020-09-15 NOTE — Assessment & Plan Note (Signed)
Colonoscopy 05/2017.  Per note, due f/u 10 years.  Will need to clarify given family history.

## 2020-09-15 NOTE — Assessment & Plan Note (Signed)
Describes intermittent hot flashes.  Persistent.  Discussed treatment options.  Off estrogen.

## 2020-09-15 NOTE — Assessment & Plan Note (Signed)
calcium score 0.  Low cholesterol diet and exercise.  Follow lipid panel.

## 2020-10-02 ENCOUNTER — Other Ambulatory Visit: Payer: Self-pay

## 2020-10-02 ENCOUNTER — Other Ambulatory Visit (INDEPENDENT_AMBULATORY_CARE_PROVIDER_SITE_OTHER): Payer: Medicare HMO

## 2020-10-02 DIAGNOSIS — E7800 Pure hypercholesterolemia, unspecified: Secondary | ICD-10-CM | POA: Diagnosis not present

## 2020-10-02 LAB — LIPID PANEL
Cholesterol: 252 mg/dL — ABNORMAL HIGH (ref 0–200)
HDL: 83.6 mg/dL (ref >39.00)
LDL Cholesterol: 154 mg/dL — ABNORMAL HIGH (ref 0–99)
NonHDL: 168.32
Total CHOL/HDL Ratio: 3
Triglycerides: 72 mg/dL (ref 0.0–149.0)
VLDL: 14.4 mg/dL (ref 0.0–40.0)

## 2020-10-02 LAB — COMPREHENSIVE METABOLIC PANEL
ALT: 13 U/L (ref 0–35)
AST: 18 U/L (ref 0–37)
Albumin: 4.4 g/dL (ref 3.5–5.2)
Alkaline Phosphatase: 61 U/L (ref 39–117)
BUN: 20 mg/dL (ref 6–23)
CO2: 28 mEq/L (ref 19–32)
Calcium: 9.4 mg/dL (ref 8.4–10.5)
Chloride: 104 mEq/L (ref 96–112)
Creatinine, Ser: 0.77 mg/dL (ref 0.40–1.20)
GFR: 80.43 mL/min (ref >60.00)
Glucose, Bld: 82 mg/dL (ref 70–99)
Potassium: 4.1 mEq/L (ref 3.5–5.1)
Sodium: 141 mEq/L (ref 135–145)
Total Bilirubin: 1.4 mg/dL — ABNORMAL HIGH (ref 0.2–1.2)
Total Protein: 6.9 g/dL (ref 6.0–8.3)

## 2020-10-03 ENCOUNTER — Other Ambulatory Visit: Payer: Self-pay | Admitting: Internal Medicine

## 2020-10-03 NOTE — Progress Notes (Signed)
Order placed for f/u liver panel.  

## 2020-10-18 ENCOUNTER — Other Ambulatory Visit: Payer: Self-pay

## 2020-10-18 ENCOUNTER — Other Ambulatory Visit (INDEPENDENT_AMBULATORY_CARE_PROVIDER_SITE_OTHER): Payer: Medicare HMO

## 2020-10-18 LAB — HEPATIC FUNCTION PANEL
ALT: 14 U/L (ref 0–35)
AST: 18 U/L (ref 0–37)
Albumin: 4.3 g/dL (ref 3.5–5.2)
Alkaline Phosphatase: 57 U/L (ref 39–117)
Bilirubin, Direct: 0.1 mg/dL (ref 0.0–0.3)
Total Bilirubin: 0.9 mg/dL (ref 0.2–1.2)
Total Protein: 6.7 g/dL (ref 6.0–8.3)

## 2021-03-11 ENCOUNTER — Other Ambulatory Visit: Payer: Self-pay

## 2021-03-11 ENCOUNTER — Other Ambulatory Visit (HOSPITAL_COMMUNITY)
Admission: RE | Admit: 2021-03-11 | Discharge: 2021-03-11 | Disposition: A | Discharge status: Home / Self Care | Payer: Medicare HMO | Source: Ambulatory Visit | Attending: Internal Medicine | Admitting: Internal Medicine

## 2021-03-11 ENCOUNTER — Encounter: Payer: Self-pay | Admitting: Internal Medicine

## 2021-03-11 ENCOUNTER — Ambulatory Visit (INDEPENDENT_AMBULATORY_CARE_PROVIDER_SITE_OTHER): Payer: Medicare HMO | Admitting: Internal Medicine

## 2021-03-11 VITALS — BP 110/70 | HR 76 | Temp 98.6°F | Ht 63.0 in | Wt 116.2 lb

## 2021-03-11 DIAGNOSIS — Z01419 Encounter for gynecological examination (general) (routine) without abnormal findings: Secondary | ICD-10-CM | POA: Insufficient documentation

## 2021-03-11 DIAGNOSIS — R945 Abnormal results of liver function studies: Secondary | ICD-10-CM

## 2021-03-11 DIAGNOSIS — Z800 Family history of malignant neoplasm of digestive organs: Secondary | ICD-10-CM | POA: Diagnosis not present

## 2021-03-11 DIAGNOSIS — N898 Other specified noninflammatory disorders of vagina: Secondary | ICD-10-CM

## 2021-03-11 DIAGNOSIS — E7800 Pure hypercholesterolemia, unspecified: Secondary | ICD-10-CM

## 2021-03-11 DIAGNOSIS — Z1231 Encounter for screening mammogram for malignant neoplasm of breast: Secondary | ICD-10-CM

## 2021-03-11 DIAGNOSIS — C7800 Secondary malignant neoplasm of unspecified lung: Secondary | ICD-10-CM | POA: Diagnosis not present

## 2021-03-11 DIAGNOSIS — M8580 Other specified disorders of bone density and structure, unspecified site: Secondary | ICD-10-CM | POA: Diagnosis not present

## 2021-03-11 DIAGNOSIS — Z0000 Encounter for general adult medical examination without abnormal findings: Secondary | ICD-10-CM | POA: Diagnosis not present

## 2021-03-11 DIAGNOSIS — R232 Flushing: Secondary | ICD-10-CM | POA: Diagnosis not present

## 2021-03-11 DIAGNOSIS — E2839 Other primary ovarian failure: Secondary | ICD-10-CM | POA: Diagnosis not present

## 2021-03-11 DIAGNOSIS — Z1151 Encounter for screening for human papillomavirus (HPV): Secondary | ICD-10-CM | POA: Insufficient documentation

## 2021-03-11 DIAGNOSIS — R69 Illness, unspecified: Secondary | ICD-10-CM | POA: Diagnosis not present

## 2021-03-11 LAB — CBC
HCT: 38.9 % (ref 36.0–46.0)
Hemoglobin: 12.8 g/dL (ref 12.0–15.0)
MCHC: 33 g/dL (ref 30.0–36.0)
MCV: 93.4 fl (ref 78.0–100.0)
Platelets: 267 10*3/uL (ref 150.0–400.0)
RBC: 4.17 Mil/uL (ref 3.87–5.11)
RDW: 12.2 % (ref 11.5–15.5)
WBC: 4.3 10*3/uL (ref 4.0–10.5)

## 2021-03-11 LAB — BASIC METABOLIC PANEL
BUN: 19 mg/dL (ref 6–23)
CO2: 28 mEq/L (ref 19–32)
Calcium: 9.2 mg/dL (ref 8.4–10.5)
Chloride: 103 mEq/L (ref 96–112)
Creatinine, Ser: 0.7 mg/dL (ref 0.40–1.20)
GFR: 89.89 mL/min (ref >60.00)
Glucose, Bld: 80 mg/dL (ref 70–99)
Potassium: 4 mEq/L (ref 3.5–5.1)
Sodium: 139 mEq/L (ref 135–145)

## 2021-03-11 LAB — LIPID PANEL
Cholesterol: 245 mg/dL — ABNORMAL HIGH (ref 0–200)
HDL: 96.8 mg/dL (ref >39.00)
LDL Cholesterol: 139 mg/dL — ABNORMAL HIGH (ref 0–99)
NonHDL: 148.49
Total CHOL/HDL Ratio: 3
Triglycerides: 48 mg/dL (ref 0.0–149.0)
VLDL: 9.6 mg/dL (ref 0.0–40.0)

## 2021-03-11 LAB — HEPATIC FUNCTION PANEL
ALT: 16 U/L (ref 0–35)
AST: 25 U/L (ref 0–37)
Albumin: 4.5 g/dL (ref 3.5–5.2)
Alkaline Phosphatase: 64 U/L (ref 39–117)
Bilirubin, Direct: 0.2 mg/dL (ref 0.0–0.3)
Total Bilirubin: 1.4 mg/dL — ABNORMAL HIGH (ref 0.2–1.2)
Total Protein: 7.3 g/dL (ref 6.0–8.3)

## 2021-03-11 LAB — TSH: TSH: 1.98 u[IU]/mL (ref 0.35–5.50)

## 2021-03-11 NOTE — Progress Notes (Signed)
Patient ID: Lisa Chen, female   DOB: [birth year], 65 y.o.   MRN: [id number]   Subjective:    Patient ID: Lisa Chen, female    DOB: 1956-03-29, 65 y.o.   MRN: [id number]  This visit occurred during the SARS-CoV-2 public health emergency.  Safety protocols were in place, including screening questions prior to the visit, additional usage of staff PPE, and extensive cleaning of exam room while observing appropriate contact time as indicated for disinfecting solutions.   Patient here for her physical exam.   Chief Complaint  Patient presents with   Annual Exam   .   HPI Reports she is doing relatively well. Main complaint is hot flashes.  Wakes her up at times. Some days not as bad.  Other days - increased symptoms.  Discussed treatment options.  She also reports noticing some hair loss.  Feels she is handling stress relatively well.  Stays active.  No chest pain or sob reported.  No abdominal pain.  Bowels moving.  Some vaginal dryness.    Past Medical History:  Diagnosis Date   Cancer ([place])    melanoma   Headache    3/WEEK, / DUE TO EYELIDS   Osteopenia    NOT PROGRESSED IN LAST 10 YEARS   Ptosis, both eyelids    UPPER   Past Surgical History:  Procedure Laterality Date   ABDOMINAL HYSTERECTOMY  1995   AUGMENTATION MAMMAPLASTY Bilateral 2000   SALINE   BREAST EXCISIONAL BIOPSY Right 1990's   NEG   BREAST SURGERY Right    BIOPSY   BROW LIFT Bilateral 04/03/2015   Procedure: BLEPHAROPLASTY;  Surgeon: Karle Starch, MD;  Location: [place];  Service: Ophthalmology;  Laterality: Bilateral;   CERVICAL DISC SURGERY  2001   COLONOSCOPY WITH PROPOFOL N/A 06/24/2017   Procedure: COLONOSCOPY WITH PROPOFOL;  Surgeon: Robert Bellow, MD;  Location: [facility];  Service: Endoscopy;  Laterality: N/A;   COSMETIC SURGERY     SKIN CANCER-MELANOMA ON BACK REMOVED-BASAL CELL ON CHEST ALSO   EYE SURGERY Bilateral 2000 AND 2002   CATARACTS   FOOT SURGERY     HERNIA  REPAIR  1984   PTOSIS REPAIR Bilateral 04/03/2015   Procedure: PTOSIS REPAIR;  Surgeon: Karle Starch, MD;  Location: [place];  Service: Ophthalmology;  Laterality: Bilateral;   TONSILLECTOMY     Family History  Problem Relation Age of Onset   Arthritis Mother    Heart disease Mother    Diabetes Mother    Heart attack Father    Heart disease Maternal Grandmother    Heart disease Maternal Grandfather    Diabetes Maternal Grandfather    Colon cancer Cousin    Colon cancer Maternal Uncle    Heart attack Maternal Uncle    Stroke Maternal Uncle    Breast cancer Neg Hx    Social History   Socioeconomic History   Marital status: Married    Spouse name: Not on file   Number of children: Not on file   Years of education: Not on file   Highest education level: Not on file  Occupational History   Not on file  Tobacco Use   Smoking status: Never   Smokeless tobacco: Never  Vaping Use   Vaping Use: Never used  Substance and Sexual Activity   Alcohol use: Yes    Alcohol/week: 3.0 standard drinks    Types: 3 Glasses of wine per week    Comment: OCCAS. none  last 24hrs   Drug use: No   Sexual activity: Not on file  Other Topics Concern   Not on file  Social History Narrative   Not on file   Social Determinants of Health   Financial Resource Strain: Low Risk    Difficulty of Paying Living Expenses: Not hard at all  Food Insecurity: No Food Insecurity   Worried About [place] in the Last Year: Never true   [place] in the Last Year: Never true  Transportation Needs: No Transportation Needs   Lack of Transportation (Medical): No   Lack of Transportation (Non-Medical): No  Physical Activity: Sufficiently Active   Days of Exercise per Week: 5 days   Minutes of Exercise per Session: 30 min  Stress: No Stress Concern Present   Feeling of Stress : Not at all  Social Connections: Socially Integrated   Frequency of Communication with Friends and Family:  More than three times a week   Frequency of Social Gatherings with Friends and Family: More than three times a week   Attends Religious Services: More than 4 times per year   Active Member of [employer] or Organizations: Yes   Attends Archivist Meetings: Not on file   Marital Status: Married     Review of Systems  Constitutional:  Negative for appetite change and unexpected weight change.  HENT:  Negative for congestion, sinus pressure and sore throat.   Eyes:  Negative for pain and visual disturbance.  Respiratory:  Negative for cough, chest tightness and shortness of breath.   Cardiovascular:  Negative for chest pain, palpitations and leg swelling.  Gastrointestinal:  Negative for abdominal pain, diarrhea, nausea and vomiting.  Genitourinary:  Negative for difficulty urinating, dysuria and frequency.  Musculoskeletal:  Negative for joint swelling and myalgias.  Skin:  Negative for color change and rash.  Neurological:  Negative for dizziness, light-headedness and headaches.  Hematological:  Negative for adenopathy. Does not bruise/bleed easily.  Psychiatric/Behavioral:  Negative for agitation and dysphoric mood.       Objective:     BP 110/70   Pulse 76   Temp 98.6 F (37 C) (Oral)   Ht 5\' 3"  (1.6 m)   Wt 116 lb 3.2 oz (52.7 kg)   SpO2 98%   BMI 20.58 kg/m  Wt Readings from Last 3 Encounters:  03/11/21 116 lb 3.2 oz (52.7 kg)  09/04/20 119 lb (54 kg)  07/03/20 117 lb (53.1 kg)    Physical Exam Vitals reviewed.  Constitutional:      General: She is not in acute distress.    Appearance: Normal appearance.  HENT:     Head: Normocephalic and atraumatic.     Right Ear: External ear normal.     Left Ear: External ear normal.  Eyes:     General: No scleral icterus.       Right eye: No discharge.        Left eye: No discharge.     Conjunctiva/sclera: Conjunctivae normal.  Neck:     Thyroid: No thyromegaly.  Cardiovascular:     Rate and Rhythm: Normal rate  and regular rhythm.  Pulmonary:     Effort: No respiratory distress.     Breath sounds: Normal breath sounds. No wheezing.     Comments: Breasts:  no nipple discharge or nipple retraction present.  Implants.  Could not appreciate any distinct nodules or axillary adenopathy to be present.  Abdominal:     General:  Bowel sounds are normal.     Palpations: Abdomen is soft.     Tenderness: There is no abdominal tenderness.  Genitourinary:    Comments: Normal external genitalia.  Vaginal vault without lesions.  Pap smear performed.  Could not appreciate any adnexal masses or tenderness.   Musculoskeletal:        General: No swelling or tenderness.     Cervical back: Neck supple. No tenderness.  Lymphadenopathy:     Cervical: No cervical adenopathy.  Skin:    Findings: No erythema or rash.  Neurological:     Mental Status: She is alert.  Psychiatric:        Mood and Affect: Mood normal.        Behavior: Behavior normal.     No outpatient encounter medications on file as of 03/11/2021.   No facility-administered encounter medications on file as of 03/11/2021.     Lab Results  Component Value Date   WBC 4.3 03/11/2021   HGB 12.8 03/11/2021   HCT 38.9 03/11/2021   PLT 267.0 03/11/2021   GLUCOSE 80 03/11/2021   CHOL 245 (H) 03/11/2021   TRIG 48.0 03/11/2021   HDL 96.80 03/11/2021   LDLCALC 139 (H) 03/11/2021   ALT 16 03/11/2021   AST 25 03/11/2021   NA 139 03/11/2021   K 4.0 03/11/2021   CL 103 03/11/2021   CREATININE 0.70 03/11/2021   BUN 19 03/11/2021   CO2 28 03/11/2021   TSH 1.98 03/11/2021    MM 3D SCREEN BREAST W/IMPLANT BILATERAL  Result Date: 04/17/2020 CLINICAL DATA:  Screening. EXAM: DIGITAL SCREENING BILATERAL MAMMOGRAM WITH IMPLANTS, CAD AND TOMO The patient has retropectoral implants. Standard and implant displaced views were performed. COMPARISON:  Previous exam(s). ACR Breast Density Category c: The breast tissue is heterogeneously dense, which may obscure  small masses. FINDINGS: There are no findings suspicious for malignancy. Images were processed with CAD. IMPRESSION: No mammographic evidence of malignancy. A result letter of this screening mammogram will be mailed directly to the patient. RECOMMENDATION: Screening mammogram in one year. (Code:SM-B-01Y) BI-RADS CATEGORY  1:  Negative. Electronically Signed   By: Claudie Revering M.D.   On: 04/17/2020 10:22       Assessment & Plan:   Problem List Items Addressed This Visit     Family history of colon cancer    Colonoscopy 05/2017.  States - maternal uncle with colon cancer.        Health care maintenance    Physical today 03/11/21.  PAP 03/11/21.  Colonoscopy 05/2017 - diverticulosis.  Recommended f/u in 10 years.  Mammogram 04/17/20 - Birads I.  Schedule f/u mammogram.       Hot flashes    Off estrogen.  Discussed with her today.  Discussed treatment options.  Wants to start with labs first.  Will notify me if desires any further treatment.        Hypercholesterolemia    calcium score 0.  Low cholesterol diet and exercise.  Follow lipid panel.       Osteopenia    Off estrogen.  Weight bearing exercise.  Calcium and vitamin D.  Will need to discuss f/u bone density.        Vaginal dryness    Discussed. Notify me if desires further treatment.  Discussed topical estrogen, etc.       Other Visit Diagnoses     Routine general medical examination at a health care facility    -  Primary   Physical  exam, routine       Relevant Orders   Lipid panel (Completed)   TSH (Completed)   CBC (Completed)   Basic Metabolic Panel (BMET) (Completed)   Hepatic function panel (Completed)   Cytology - PAP( [place])   Encounter for screening mammogram for malignant neoplasm of breast       Relevant Orders   MS Digital Screening W/ Implants        Einar Pheasant, MD

## 2021-03-11 NOTE — Assessment & Plan Note (Addendum)
Physical today 03/11/21.  PAP 03/11/21.  Colonoscopy 05/2017 - diverticulosis.  Recommended f/u in 10 years.  Mammogram 04/17/20 - Birads I.  Schedule f/u mammogram.

## 2021-03-12 ENCOUNTER — Encounter: Payer: Self-pay | Admitting: Internal Medicine

## 2021-03-12 ENCOUNTER — Telehealth: Payer: Self-pay | Admitting: Internal Medicine

## 2021-03-12 NOTE — Assessment & Plan Note (Signed)
calcium score 0.  Low cholesterol diet and exercise.  Follow lipid panel.

## 2021-03-12 NOTE — Addendum Note (Signed)
Addended by: Lars Masson on: 03/12/2021 01:19 PM   Modules accepted: Orders

## 2021-03-12 NOTE — Assessment & Plan Note (Signed)
Off estrogen.  Weight bearing exercise.  Calcium and vitamin D.  Will need to discuss f/u bone density.

## 2021-03-12 NOTE — Assessment & Plan Note (Signed)
Discussed. Notify me if desires further treatment.  Discussed topical estrogen, etc.

## 2021-03-12 NOTE — Telephone Encounter (Signed)
Mammogram and bone density scheduled. My chart sent to patient with time and date per patient request.

## 2021-03-12 NOTE — Telephone Encounter (Signed)
Please schedule her for mammogram.  Has been ordered.  Also, please see if agreeable for bone density.  Can be scheduled at the same time.  Thanks

## 2021-03-12 NOTE — Assessment & Plan Note (Signed)
Off estrogen.  Discussed with her today.  Discussed treatment options.  Wants to start with labs first.  Will notify me if desires any further treatment.

## 2021-03-12 NOTE — Assessment & Plan Note (Signed)
Colonoscopy 05/2017.  States - maternal uncle with colon cancer.

## 2021-03-14 LAB — CYTOLOGY - PAP
Adequacy: ABSENT
Comment: NEGATIVE
Diagnosis: NEGATIVE
High risk HPV: NEGATIVE

## 2021-04-08 ENCOUNTER — Other Ambulatory Visit (INDEPENDENT_AMBULATORY_CARE_PROVIDER_SITE_OTHER): Payer: Medicare HMO

## 2021-04-08 ENCOUNTER — Other Ambulatory Visit: Payer: Self-pay

## 2021-04-08 DIAGNOSIS — R945 Abnormal results of liver function studies: Secondary | ICD-10-CM

## 2021-04-08 LAB — HEPATIC FUNCTION PANEL
ALT: 16 U/L (ref 0–35)
AST: 22 U/L (ref 0–37)
Albumin: 4.4 g/dL (ref 3.5–5.2)
Alkaline Phosphatase: 67 U/L (ref 39–117)
Bilirubin, Direct: 0.2 mg/dL (ref 0.0–0.3)
Total Bilirubin: 1.2 mg/dL (ref 0.2–1.2)
Total Protein: 6.9 g/dL (ref 6.0–8.3)

## 2021-04-16 ENCOUNTER — Ambulatory Visit
Admission: RE | Admit: 2021-04-16 | Discharge: 2021-04-16 | Disposition: A | Discharge status: Home / Self Care | Payer: Medicare HMO | Source: Ambulatory Visit | Attending: Internal Medicine | Admitting: Internal Medicine

## 2021-04-16 ENCOUNTER — Other Ambulatory Visit: Payer: Self-pay

## 2021-04-16 DIAGNOSIS — E2839 Other primary ovarian failure: Secondary | ICD-10-CM | POA: Insufficient documentation

## 2021-04-16 DIAGNOSIS — Z780 Asymptomatic menopausal state: Secondary | ICD-10-CM | POA: Diagnosis not present

## 2021-04-16 DIAGNOSIS — Z1231 Encounter for screening mammogram for malignant neoplasm of breast: Secondary | ICD-10-CM | POA: Diagnosis not present

## 2021-04-16 DIAGNOSIS — M810 Age-related osteoporosis without current pathological fracture: Secondary | ICD-10-CM | POA: Diagnosis not present

## 2021-04-16 DIAGNOSIS — M85852 Other specified disorders of bone density and structure, left thigh: Secondary | ICD-10-CM | POA: Diagnosis not present

## 2021-05-17 ENCOUNTER — Ambulatory Visit (INDEPENDENT_AMBULATORY_CARE_PROVIDER_SITE_OTHER): Payer: Medicare HMO | Admitting: Internal Medicine

## 2021-05-17 ENCOUNTER — Other Ambulatory Visit: Payer: Self-pay

## 2021-05-17 DIAGNOSIS — N898 Other specified noninflammatory disorders of vagina: Secondary | ICD-10-CM | POA: Diagnosis not present

## 2021-05-17 DIAGNOSIS — M810 Age-related osteoporosis without current pathological fracture: Secondary | ICD-10-CM

## 2021-05-17 DIAGNOSIS — E7800 Pure hypercholesterolemia, unspecified: Secondary | ICD-10-CM

## 2021-05-17 MED ORDER — ESTRADIOL 0.1 MG/GM VA CREA: TOPICAL_CREAM | VAGINAL | 2 refills | Status: DC

## 2021-05-17 NOTE — Progress Notes (Signed)
Patient ID: Lisa Chen, female   DOB: 1955-06-13, 66 y.o.   MRN: [id number]   Subjective:    Patient ID: Lisa Chen, female    DOB: 07/24/1955, 66 y.o.   MRN: [id number]  This visit occurred during the SARS-CoV-2 public health emergency.  Safety protocols were in place, including screening questions prior to the visit, additional usage of staff PPE, and extensive cleaning of exam room while observing appropriate contact time as indicated for disinfecting solutions.   Patient here for work in appt to discuss osteoporosis.    Chief Complaint  Patient presents with   Osteoporosis   .   HPI Recent bone density - -3.2 spine.  Discussed bone density results and osteoporosis.  Discussed treatment options, including calcium, vitamin D and weight bearing exercise.  Also discussed prescription treatment options, including bisphosphonates, reclast and prolia.  Discussed possible side effects and risks of medication.  She denies any swallowing issues or increased acid reflux.  No abdominal pain.  Tries to stay active.  Discussed her concerns regarding vaginal dryness.  She is off oral estrogen.  Discussed trial of topical estrogen.    Past Medical History:  Diagnosis Date   Cancer ([place])    melanoma   Headache    3/WEEK, / DUE TO EYELIDS   Osteopenia    NOT PROGRESSED IN LAST 10 YEARS   Ptosis, both eyelids    UPPER   Past Surgical History:  Procedure Laterality Date   ABDOMINAL HYSTERECTOMY  1995   AUGMENTATION MAMMAPLASTY Bilateral 2000   SALINE   BREAST EXCISIONAL BIOPSY Right 1990's   NEG   BREAST SURGERY Right    BIOPSY   BROW LIFT Bilateral 04/03/2015   Procedure: BLEPHAROPLASTY;  Surgeon: Karle Starch, MD;  Location: [place];  Service: Ophthalmology;  Laterality: Bilateral;   CERVICAL DISC SURGERY  2001   COLONOSCOPY WITH PROPOFOL N/A 06/24/2017   Procedure: COLONOSCOPY WITH PROPOFOL;  Surgeon: Robert Bellow, MD;  Location: [facility];  Service:  Endoscopy;  Laterality: N/A;   COSMETIC SURGERY     SKIN CANCER-MELANOMA ON BACK REMOVED-BASAL CELL ON CHEST ALSO   EYE SURGERY Bilateral 2000 AND 2002   CATARACTS   FOOT SURGERY     HERNIA REPAIR  1984   PTOSIS REPAIR Bilateral 04/03/2015   Procedure: PTOSIS REPAIR;  Surgeon: Karle Starch, MD;  Location: [place];  Service: Ophthalmology;  Laterality: Bilateral;   TONSILLECTOMY     Family History  Problem Relation Age of Onset   Arthritis Mother    Heart disease Mother    Diabetes Mother    Heart attack Father    Heart disease Maternal Grandmother    Heart disease Maternal Grandfather    Diabetes Maternal Grandfather    Colon cancer Cousin    Colon cancer Maternal Uncle    Heart attack Maternal Uncle    Stroke Maternal Uncle    Breast cancer Neg Hx    Social History   Socioeconomic History   Marital status: Married    Spouse name: Not on file   Number of children: Not on file   Years of education: Not on file   Highest education level: Not on file  Occupational History   Not on file  Tobacco Use   Smoking status: Never   Smokeless tobacco: Never  Vaping Use   Vaping Use: Never used  Substance and Sexual Activity   Alcohol use: Yes    Alcohol/week: 3.0 standard  drinks    Types: 3 Glasses of wine per week    Comment: OCCAS. none last 24hrs   Drug use: No   Sexual activity: Not on file  Other Topics Concern   Not on file  Social History Narrative   Not on file   Social Determinants of Health   Financial Resource Strain: Low Risk    Difficulty of Paying Living Expenses: Not hard at all  Food Insecurity: No Food Insecurity   Worried About Running Out of Food in the Last Year: Never true   [place] in the Last Year: Never true  Transportation Needs: No Transportation Needs   Lack of Transportation (Medical): No   Lack of Transportation (Non-Medical): No  Physical Activity: Sufficiently Active   Days of Exercise per Week: 5 days   Minutes of  Exercise per Session: 30 min  Stress: No Stress Concern Present   Feeling of Stress : Not at all  Social Connections: Socially Integrated   Frequency of Communication with Friends and Family: More than three times a week   Frequency of Social Gatherings with Friends and Family: More than three times a week   Attends Religious Services: More than 4 times per year   Active Member of [employer] or Organizations: Yes   Attends Archivist Meetings: Not on file   Marital Status: Married     Review of Systems  Constitutional:  Negative for appetite change and unexpected weight change.  HENT:  Negative for congestion and sinus pressure.   Respiratory:  Negative for cough, chest tightness and shortness of breath.   Cardiovascular:  Negative for chest pain, palpitations and leg swelling.  Gastrointestinal:  Negative for abdominal pain, diarrhea, nausea and vomiting.  Genitourinary:  Negative for difficulty urinating and dysuria.  Musculoskeletal:  Negative for joint swelling and myalgias.  Skin:  Negative for color change and rash.  Neurological:  Negative for dizziness, light-headedness and headaches.  Psychiatric/Behavioral:  Negative for agitation and dysphoric mood.       Objective:     BP 118/70    Pulse 74    Temp (!) 97.4 F (36.3 C)    Resp 16    Ht 5\' 3"  (1.6 m)    Wt 118 lb 6.4 oz (53.7 kg)    SpO2 99%    BMI 20.97 kg/m  Wt Readings from Last 3 Encounters:  05/17/21 118 lb 6.4 oz (53.7 kg)  03/11/21 116 lb 3.2 oz (52.7 kg)  09/04/20 119 lb (54 kg)    Physical Exam Vitals reviewed.  Constitutional:      General: She is not in acute distress.    Appearance: Normal appearance.  HENT:     Head: Normocephalic and atraumatic.     Right Ear: External ear normal.     Left Ear: External ear normal.  Eyes:     General: No scleral icterus.       Right eye: No discharge.        Left eye: No discharge.     Conjunctiva/sclera: Conjunctivae normal.  Neck:     Thyroid: No  thyromegaly.  Cardiovascular:     Rate and Rhythm: Normal rate and regular rhythm.  Pulmonary:     Effort: No respiratory distress.     Breath sounds: Normal breath sounds. No wheezing.  Abdominal:     General: Bowel sounds are normal.     Palpations: Abdomen is soft.     Tenderness: There is no abdominal  tenderness.  Musculoskeletal:        General: No swelling or tenderness.     Cervical back: Neck supple. No tenderness.  Lymphadenopathy:     Cervical: No cervical adenopathy.  Skin:    Findings: No erythema or rash.  Neurological:     Mental Status: She is alert.  Psychiatric:        Mood and Affect: Mood normal.        Behavior: Behavior normal.     Outpatient Encounter Medications as of 05/17/2021  Medication Sig   estradiol (ESTRACE VAGINAL) 0.1 MG/GM vaginal cream Use one applicator q day x 5 days and then continue two times per week.   No facility-administered encounter medications on file as of 05/17/2021.     Lab Results  Component Value Date   WBC 4.3 03/11/2021   HGB 12.8 03/11/2021   HCT 38.9 03/11/2021   PLT 267.0 03/11/2021   GLUCOSE 80 03/11/2021   CHOL 245 (H) 03/11/2021   TRIG 48.0 03/11/2021   HDL 96.80 03/11/2021   LDLCALC 139 (H) 03/11/2021   ALT 16 04/08/2021   AST 22 04/08/2021   NA 139 03/11/2021   K 4.0 03/11/2021   CL 103 03/11/2021   CREATININE 0.70 03/11/2021   BUN 19 03/11/2021   CO2 28 03/11/2021   TSH 1.98 03/11/2021    DG Bone Density  Result Date: 04/16/2021 EXAM: DUAL X-RAY ABSORPTIOMETRY (DXA) FOR BONE MINERAL DENSITY IMPRESSION: Your patient Hayli Milligan completed a BMD test on 04/16/2021 using the [place] (software version: 14.10) manufactured by [employer]. The following summarizes the results of our evaluation. Technologist: SCE PATIENT BIOGRAPHICAL: Name: Crislyn, Willbanks Patient ID: [id number] Birth Date: [birth year] Height: 63.0 in. Gender: Female Exam Date: 04/16/2021 Weight: 118.0 lbs.  Indications: Caucasian, Hysterectomy, Oophorectomy Bilateral, Osteopenia, Postmenopausal Fractures: Treatments: Vit D DENSITOMETRY RESULTS: Site      Region     Measured Date Measured Age WHO Classification Young Adult T-score BMD         %Change vs. Previous Significant Change (*) AP Spine L1-L4 (L3) 04/16/2021 66.8 Osteoporosis -3.2 0.796 g/cm2 -19.0% Yes AP Spine L1-L4 (L3) 08/16/2015 61.2 Osteopenia -1.6 0.983 g/cm2 6.4% Yes AP Spine L1-L4 (L3) 05/07/2010 55.9 Osteopenia -2.1 0.924 g/cm2 - - DualFemur Neck Left 04/16/2021 66.8 Osteopenia -1.5 0.835 g/cm2 -4.9% - DualFemur Neck Left 08/16/2015 61.2 Osteopenia -1.2 0.878 g/cm2 -3.3% - DualFemur Neck Left 05/07/2010 55.9 Normal -0.9 0.908 g/cm2 - - DualFemur Total Mean 04/16/2021 66.8 Osteopenia -1.3 0.843 g/cm2 -7.0% Yes DualFemur Total Mean 08/16/2015 61.2 Normal -0.8 0.906 g/cm2 0.0% - DualFemur Total Mean 05/07/2010 55.9 Normal -0.8 0.906 g/cm2 - - ASSESSMENT: The BMD measured at AP Spine L1-L4 (L3) is 0.796 g/cm2 with a T-score of -3.2. This patient is considered osteoporotic according to [facility]) criteria. The scan quality is good. L3 was excluded due to degenerative changes. Compared with prior study, there has been a significant decrease in the spine. Compared with prior study, there has been a significant decrease in the total hip. World Pharmacologist [facility]) criteria for post-menopausal, Caucasian Women: Normal:                   T-score at or above -1 SD Osteopenia/low bone mass: T-score between -1 and -2.5 SD Osteoporosis:             T-score at or below -2.5 SD RECOMMENDATIONS: 1. All patients should optimize calcium and vitamin D intake.  2. Consider FDA-approved medical therapies in postmenopausal women and men aged 63 years and older, based on the following: a. A hip or vertebral(clinical or morphometric) fracture b. T-score < -2.5 at the femoral neck or spine after appropriate evaluation to exclude secondary causes c. Low  bone mass (T-score between -1.0 and -2.5 at the femoral neck or spine) and a 10-year probability of a hip fracture > 3% or a 10-year probability of a major osteoporosis-related fracture > 20% based on the [place]-adapted WHO algorithm 3. Clinician judgment and/or patient preferences may indicate treatment for people with 10-year fracture probabilities above or below these levels FOLLOW-UP: People with diagnosed cases of osteoporosis or at high risk for fracture should have regular bone mineral density tests. For patients eligible for Medicare, routine testing is allowed once every 2 years. The testing frequency can be increased to one year for patients who have rapidly progressing disease, those who are receiving or discontinuing medical therapy to restore bone mass, or have additional risk factors. I have reviewed this report, and agree with the above findings. Mark A. Thornton Papas, M.D. [facility] Electronically Signed   By: Lavonia Dana M.D.   On: 04/16/2021 12:19   MM 3D SCREEN BREAST W/IMPLANT BILATERAL  Result Date: 04/16/2021 CLINICAL DATA:  Screening. EXAM: DIGITAL SCREENING BILATERAL MAMMOGRAM WITH IMPLANTS, CAD AND TOMOSYNTHESIS TECHNIQUE: Bilateral screening digital craniocaudal and mediolateral oblique mammograms were obtained. Bilateral screening digital breast tomosynthesis was performed. The images were evaluated with computer-aided detection. Standard and/or implant displaced views were performed. COMPARISON:  Previous exam(s). ACR Breast Density Category b: There are scattered areas of fibroglandular density. FINDINGS: The patient has retropectoral implants. There are no findings suspicious for malignancy. IMPRESSION: No mammographic evidence of malignancy. A result letter of this screening mammogram will be mailed directly to the patient. RECOMMENDATION: Screening mammogram in one year. (Code:SM-B-01Y) BI-RADS CATEGORY  1:  Negative. Electronically Signed   By: Audie Pinto M.D.   On:  04/16/2021 16:02      Assessment & Plan:   Problem List Items Addressed This Visit     Hypercholesterolemia    calcium score 0.  Low cholesterol diet and exercise.  Follow lipid panel.       Osteoporosis    Off estrogen.  Discussed weight bearing exercise, calcium and vitamin D.   Discussed recent bone density results and discussed prescription treatment options, including bisphosphonates, reclast and prolia.  Discussed possible side effects and risks of medication.  She denies any swallowing issues or increased acid reflux.  Information given.  She will notify me if desires to start prescription medication.        Vaginal dryness    Off oral estrogen.  rx for estrace.  Discussed dosing.         Einar Pheasant, MD

## 2021-05-25 ENCOUNTER — Encounter: Payer: Self-pay | Admitting: Internal Medicine

## 2021-05-25 NOTE — Assessment & Plan Note (Signed)
Off oral estrogen.  rx for estrace.  Discussed dosing.

## 2021-05-25 NOTE — Assessment & Plan Note (Signed)
Off estrogen.  Discussed weight bearing exercise, calcium and vitamin D.   Discussed recent bone density results and discussed prescription treatment options, including bisphosphonates, reclast and prolia.  Discussed possible side effects and risks of medication.  She denies any swallowing issues or increased acid reflux.  Information given.  She will notify me if desires to start prescription medication.

## 2021-05-25 NOTE — Assessment & Plan Note (Signed)
calcium score 0.  Low cholesterol diet and exercise.  Follow lipid panel.

## 2021-07-04 ENCOUNTER — Ambulatory Visit: Payer: Medicare HMO

## 2021-07-12 DIAGNOSIS — D2262 Melanocytic nevi of left upper limb, including shoulder: Secondary | ICD-10-CM | POA: Diagnosis not present

## 2021-07-12 DIAGNOSIS — D2261 Melanocytic nevi of right upper limb, including shoulder: Secondary | ICD-10-CM | POA: Diagnosis not present

## 2021-07-12 DIAGNOSIS — Z85828 Personal history of other malignant neoplasm of skin: Secondary | ICD-10-CM | POA: Diagnosis not present

## 2021-07-12 DIAGNOSIS — L570 Actinic keratosis: Secondary | ICD-10-CM | POA: Diagnosis not present

## 2021-07-12 DIAGNOSIS — L300 Nummular dermatitis: Secondary | ICD-10-CM | POA: Diagnosis not present

## 2021-07-12 DIAGNOSIS — Z85820 Personal history of malignant melanoma of skin: Secondary | ICD-10-CM | POA: Diagnosis not present

## 2021-07-18 ENCOUNTER — Ambulatory Visit (INDEPENDENT_AMBULATORY_CARE_PROVIDER_SITE_OTHER): Payer: Medicare HMO

## 2021-07-18 VITALS — Ht 63.0 in | Wt 118.0 lb

## 2021-07-18 DIAGNOSIS — Z0000 Encounter for general adult medical examination without abnormal findings: Secondary | ICD-10-CM | POA: Diagnosis not present

## 2021-07-18 NOTE — Patient Instructions (Addendum)
Lisa Chen , Thank you for taking time to come for your Medicare Wellness Visit. I appreciate your ongoing commitment to your health goals. Please review the following plan we discussed and let me know if I can assist you in the future.   These are the goals we discussed:  Goals       Patient Stated     Maintain Healthy Lifestyle (pt-stated)      Stay active walking 10,000 steps daily Healthy diet        This is a list of the screening recommended for you and due dates:  Health Maintenance  Topic Date Due   COVID-19 Vaccine (4 - Booster for Pfizer series) 08/03/2021*   Zoster (Shingles) Vaccine (1 of 2) 08/15/2021*   Hepatitis C Screening: USPSTF Recommendation to screen - Ages 18-79 yo.  08/26/2021*   Pneumonia Vaccine (1 - PCV) 05/17/2022*   Tetanus Vaccine  07/19/2022*   Mammogram  04/17/2023   Colon Cancer Screening  06/25/2027   Flu Shot  Completed   DEXA scan (bone density measurement)  Completed   HPV Vaccine  Aged Out  *Topic was postponed. The date shown is not the original due date.    Tdap due now. Check with your local pharmacy or Health Department. Provide a copy of vaccination record if obtained from local pharmacy or Health Department.   Advanced directives: End of life planning; Advance aging; Advanced directives discussed.  Copy of current HCPOA/Living Will requested.    Conditions/risks identified: none new.   Follow up in one year for your annual wellness visit    Preventive Care 65 Years and Older, Female Preventive care refers to lifestyle choices and visits with your health care provider that can promote health and wellness. What does preventive care include? A yearly physical exam. This is also called an annual well check. Dental exams once or twice a year. Routine eye exams. Ask your health care provider how often you should have your eyes checked. Personal lifestyle choices, including: Daily care of your teeth and gums. Regular physical  activity. Eating a healthy diet. Avoiding tobacco and drug use. Limiting alcohol use. Practicing safe sex. Taking low-dose aspirin every day. Taking vitamin and mineral supplements as recommended by your health care provider. What happens during an annual well check? The services and screenings done by your health care provider during your annual well check will depend on your age, overall health, lifestyle risk factors, and family history of disease. Counseling  Your health care provider may ask you questions about your: Alcohol use. Tobacco use. Drug use. Emotional well-being. Home and relationship well-being. Sexual activity. Eating habits. History of falls. Memory and ability to understand (cognition). Work and work Astronomer. Reproductive health. Screening  You may have the following tests or measurements: Height, weight, and BMI. Blood pressure. Lipid and cholesterol levels. These may be checked every 5 years, or more frequently if you are over 62 years old. Skin check. Lung cancer screening. You may have this screening every year starting at age 63 if you have a 30-pack-year history of smoking and currently smoke or have quit within the past 15 years. Fecal occult blood test (FOBT) of the stool. You may have this test every year starting at age 54. Flexible sigmoidoscopy or colonoscopy. You may have a sigmoidoscopy every 5 years or a colonoscopy every 10 years starting at age 48. Hepatitis C blood test. Hepatitis B blood test. Sexually transmitted disease (STD) testing. Diabetes screening. This is done by  checking your blood sugar (glucose) after you have not eaten for a while (fasting). You may have this done every 1-3 years. Bone density scan. This is done to screen for osteoporosis. You may have this done starting at age 5. Mammogram. This may be done every 1-2 years. Talk to your health care provider about how often you should have regular mammograms. Talk with your  health care provider about your test results, treatment options, and if necessary, the need for more tests. Vaccines  Your health care provider may recommend certain vaccines, such as: Influenza vaccine. This is recommended every year. Tetanus, diphtheria, and acellular pertussis (Tdap, Td) vaccine. You may need a Td booster every 10 years. Zoster vaccine. You may need this after age 58. Pneumococcal 13-valent conjugate (PCV13) vaccine. One dose is recommended after age 69. Pneumococcal polysaccharide (PPSV23) vaccine. One dose is recommended after age 64. Talk to your health care provider about which screenings and vaccines you need and how often you need them. This information is not intended to replace advice given to you by your health care provider. Make sure you discuss any questions you have with your health care provider. Document Released: 05/11/2015 Document Revised: 01/02/2016 Document Reviewed: 02/13/2015 Elsevier Interactive Patient Education  2017 [employer].  Fall Prevention in the Home Falls can cause injuries. They can happen to people of all ages. There are many things you can do to make your home safe and to help prevent falls. What can I do on the outside of my home? Regularly fix the edges of walkways and driveways and fix any cracks. Remove anything that might make you trip as you walk through a door, such as a raised step or threshold. Trim any bushes or trees on the path to your home. Use bright outdoor lighting. Clear any walking paths of anything that might make someone trip, such as rocks or tools. Regularly check to see if handrails are loose or broken. Make sure that both sides of any steps have handrails. Any raised decks and porches should have guardrails on the edges. Have any leaves, snow, or ice cleared regularly. Use sand or salt on walking paths during winter. Clean up any spills in your garage right away. This includes oil or grease spills. What can I  do in the bathroom? Use night lights. Install grab bars by the toilet and in the tub and shower. Do not use towel bars as grab bars. Use non-skid mats or decals in the tub or shower. If you need to sit down in the shower, use a plastic, non-slip stool. Keep the floor dry. Clean up any water that spills on the floor as soon as it happens. Remove soap buildup in the tub or shower regularly. Attach bath mats securely with double-sided non-slip rug tape. Do not have throw rugs and other things on the floor that can make you trip. What can I do in the bedroom? Use night lights. Make sure that you have a light by your bed that is easy to reach. Do not use any sheets or blankets that are too big for your bed. They should not hang down onto the floor. Have a firm chair that has side arms. You can use this for support while you get dressed. Do not have throw rugs and other things on the floor that can make you trip. What can I do in the kitchen? Clean up any spills right away. Avoid walking on wet floors. Keep items that you use a  lot in easy-to-reach places. If you need to reach something above you, use a strong step stool that has a grab bar. Keep electrical cords out of the way. Do not use floor polish or wax that makes floors slippery. If you must use wax, use non-skid floor wax. Do not have throw rugs and other things on the floor that can make you trip. What can I do with my stairs? Do not leave any items on the stairs. Make sure that there are handrails on both sides of the stairs and use them. Fix handrails that are broken or loose. Make sure that handrails are as long as the stairways. Check any carpeting to make sure that it is firmly attached to the stairs. Fix any carpet that is loose or worn. Avoid having throw rugs at the top or bottom of the stairs. If you do have throw rugs, attach them to the floor with carpet tape. Make sure that you have a light switch at the top of the stairs  and the bottom of the stairs. If you do not have them, ask someone to add them for you. What else can I do to help prevent falls? Wear shoes that: Do not have high heels. Have rubber bottoms. Are comfortable and fit you well. Are closed at the toe. Do not wear sandals. If you use a stepladder: Make sure that it is fully opened. Do not climb a closed stepladder. Make sure that both sides of the stepladder are locked into place. Ask someone to hold it for you, if possible. Clearly mark and make sure that you can see: Any grab bars or handrails. First and last steps. Where the edge of each step is. Use tools that help you move around (mobility aids) if they are needed. These include: Canes. Walkers. Scooters. Crutches. Turn on the lights when you go into a dark area. Replace any light bulbs as soon as they burn out. Set up your furniture so you have a clear path. Avoid moving your furniture around. If any of your floors are uneven, fix them. If there are any pets around you, be aware of where they are. Review your medicines with your doctor. Some medicines can make you feel dizzy. This can increase your chance of falling. Ask your doctor what other things that you can do to help prevent falls. This information is not intended to replace advice given to you by your health care provider. Make sure you discuss any questions you have with your health care provider. Document Released: 02/08/2009 Document Revised: 09/20/2015 Document Reviewed: 05/19/2014 Elsevier Interactive Patient Education  2017 [employer].

## 2021-07-18 NOTE — Progress Notes (Signed)
Subjective:   Lisa Chen is a 66 y.o. female who presents for Medicare Annual (Subsequent) preventive examination.  Review of Systems    No ROS.  Medicare Wellness Virtual Visit.  Visual/audio telehealth visit, UTA vital signs.   See social history for additional risk factors.   Cardiac Risk Factors include: advanced age (>106men, >68 women)     Objective:    Today's Vitals   07/18/21 1535  Weight: 118 lb (53.5 kg)  Height: 5\' 3"  (1.6 m)   Body mass index is 20.9 kg/m.     07/18/2021    3:42 PM 06/24/2017    7:52 AM 04/03/2015   10:20 AM  Advanced Directives  Does Patient Have a Medical Advance Directive? Yes Yes Yes  Type of Estate agent of [place];Living will Healthcare Power of [place];Living will Healthcare Power of [place];Living will  Does patient want to make changes to medical advance directive? No - Patient declined  No - Patient declined  Copy of Healthcare Power of Attorney in Chart? No - copy requested Yes No - copy requested    Current Medications (verified) Outpatient Encounter Medications as of 07/18/2021  Medication Sig   estradiol (ESTRACE VAGINAL) 0.1 MG/GM vaginal cream Use one applicator q day x 5 days and then continue two times per week.   No facility-administered encounter medications on file as of 07/18/2021.   Allergies (verified) Erythromycin and Other   History: Past Medical History:  Diagnosis Date   Cancer (HCC)    melanoma   Headache    3/WEEK, / DUE TO EYELIDS   Osteopenia    NOT PROGRESSED IN LAST 10 YEARS   Ptosis, both eyelids    UPPER   Past Surgical History:  Procedure Laterality Date   ABDOMINAL HYSTERECTOMY  1995   AUGMENTATION MAMMAPLASTY Bilateral 2000   SALINE   BREAST EXCISIONAL BIOPSY Right 1990's   NEG   BREAST SURGERY Right    BIOPSY   BROW LIFT Bilateral 04/03/2015   Procedure: BLEPHAROPLASTY;  Surgeon: Imagene Riches, MD;  [facility]: [facility];  Service: Ophthalmology;   Laterality: Bilateral;   CERVICAL DISC SURGERY  2001   COLONOSCOPY WITH PROPOFOL N/A 06/24/2017   Procedure: COLONOSCOPY WITH PROPOFOL;  Surgeon: Earline Mayotte, MD;  Location: [facility];  Service: Endoscopy;  Laterality: N/A;   COSMETIC SURGERY     SKIN CANCER-MELANOMA ON BACK REMOVED-BASAL CELL ON CHEST ALSO   EYE SURGERY Bilateral 2000 AND 2002   CATARACTS   FOOT SURGERY     HERNIA REPAIR  1984   PTOSIS REPAIR Bilateral 04/03/2015   Procedure: PTOSIS REPAIR;  Surgeon: Imagene Riches, MD;  Location: [facility];  Service: Ophthalmology;  Laterality: Bilateral;   TONSILLECTOMY     Family History  Problem Relation Age of Onset   Arthritis Mother    Heart disease Mother    Diabetes Mother    Heart attack Father    Heart disease Maternal Grandmother    Heart disease Maternal Grandfather    Diabetes Maternal Grandfather    Colon cancer Cousin    Colon cancer Maternal Uncle    Heart attack Maternal Uncle    Stroke Maternal Uncle    Breast cancer Neg Hx    Social History   Socioeconomic History   Marital status: Married    Spouse name: Not on file   Number of children: Not on file   Years of education: Not on file   Highest education  level: Not on file  Occupational History   Not on file  Tobacco Use   Smoking status: Never   Smokeless tobacco: Never  Vaping Use   Vaping Use: Never used  Substance and Sexual Activity   Alcohol use: Yes    Alcohol/week: 3.0 standard drinks    Types: 3 Glasses of wine per week    Comment: OCCAS. none last 24hrs   Drug use: No   Sexual activity: Not on file  Other Topics Concern   Not on file  Social History Narrative   Not on file   Social Determinants of Health   Financial Resource Strain: Low Risk    Difficulty of Paying Living Expenses: Not hard at all  Food Insecurity: No Food Insecurity   Worried About Running Out of Food in the Last Year: Never true   Ran Out of Food in the Last Year: Never true   Transportation Needs: No Transportation Needs   Lack of Transportation (Medical): No   Lack of Transportation (Non-Medical): No  Physical Activity: Sufficiently Active   Days of Exercise per Week: 5 days   Minutes of Exercise per Session: 30 min  Stress: No Stress Concern Present   Feeling of Stress : Not at all  Social Connections: Socially Integrated   Frequency of Communication with Friends and Family: More than three times a week   Frequency of Social Gatherings with Friends and Family: More than three times a week   Attends Religious Services: More than 4 times per year   Active Member of [employer] or Organizations: Yes   Attends Banker Meetings: Not on file   Marital Status: Married   Tobacco Counseling Counseling given: Not Answered  Clinical Intake: Pre-visit preparation completed: Yes        Diabetes: No  How often do you need to have someone help you when you read instructions, pamphlets, or other written materials from your doctor or pharmacy?: 1 - Never   Interpreter Needed?: No    Activities of Daily Living    07/18/2021    3:44 PM  In your present state of health, do you have any difficulty performing the following activities:  Hearing? 0  Vision? 0  Difficulty concentrating or making decisions? 0  Walking or climbing stairs? 0  Dressing or bathing? 0  Doing errands, shopping? 0  Preparing Food and eating ? N  Using the Toilet? N  In the past six months, have you accidently leaked urine? N  Do you have problems with loss of bowel control? N  Managing your Medications? N  Managing your Finances? N  Housekeeping or managing your Housekeeping? N   Patient Care Team: Dale Udall, MD as PCP - General (Internal Medicine) Dale [place], MD (Internal Medicine) Lemar Livings Merrily Pew, MD (General Surgery)  Indicate any recent Medical Services you may have received from other than [facility] providers in the past year (date may be approximate).      Assessment:   This is a routine wellness examination for Lisa Chen.  Virtual Visit via Telephone Note  I connected with  KAITLYNE LACROSSE on 07/18/21 at  3:30 PM EDT by telephone and verified that I am speaking with the correct person using two identifiers.  Persons participating in the virtual visit: patient/Nurse Health Advisor   I discussed the limitations of performing an evaluation service by telehealth. The patient expressed understanding and agreed to proceed. We continued and completed visit with audio only. Some vital signs may be  absent or patient reported.   Hearing/Vision screen Hearing Screening - Comments:: Patient is able to hear conversational tones without difficulty.  No issues reported. Vision Screening - Comments:: Cataract extraction, bilateral Wears readers occasional They have seen their ophthalmologist in the last 12 months.   Dietary issues and exercise activities discussed: Current Exercise Habits: Home exercise routine, Type of exercise: walking, Intensity: Moderate Healthy diet Good water intake   Goals Addressed               This Visit's Progress     Patient Stated     Maintain Healthy Lifestyle (pt-stated)        Stay active walking 10,000 steps daily Healthy diet       Depression Screen    07/18/2021    3:44 PM 03/11/2021    9:07 AM 07/03/2020    1:49 PM 03/07/2020    8:35 AM 01/10/2019    4:42 PM 05/20/2017    9:02 AM 12/26/2016    2:31 PM  PHQ 2/9 Scores  PHQ - 2 Score 0 0 0 0 0 0 0  PHQ- 9 Score       0    Fall Risk    07/18/2021    3:44 PM 07/03/2020    1:52 PM 03/07/2020    8:34 AM 05/20/2017    9:02 AM 12/26/2016    2:27 PM  Fall Risk   Falls in the past year? 0 0 0 No No  Number falls in past yr: 0 0     Injury with Fall?  0     Follow up Falls evaluation completed Falls evaluation completed Falls evaluation completed     FALL RISK PREVENTION PERTAINING TO THE HOME: Home free of loose throw rugs in walkways, pet beds,  electrical cords, etc? Yes  Adequate lighting in your home to reduce risk of falls? Yes   ASSISTIVE DEVICES UTILIZED TO PREVENT FALLS: Life alert? No  Use of a cane, walker or w/c? No   TIMED UP AND GO: Was the test performed? No .   Cognitive Function:  Patient is alert and oriented x3.  Normal cognitive status assessed by direct observation/communication. No abnormalities found.       Immunizations Immunization History  Administered Date(s) Administered   Influenza, High Dose Seasonal PF 02/18/2021   Influenza,inj,Quad PF,6+ Mos 12/26/2016, 12/30/2017, 01/10/2019, 03/07/2020   PFIZER Comirnaty(Gray Top)Covid-19 Tri-Sucrose Vaccine 06/03/2019, 06/24/2019, 04/23/2020   TDAP status: Due, Education has been provided regarding the importance of this vaccine. Advised may receive this vaccine at local pharmacy or Health Dept. Aware to provide a copy of the vaccination record if obtained from local pharmacy or Health Dept. Verbalized acceptance and understanding.  Screening Tests Health Maintenance  Topic Date Due   COVID-19 Vaccine (4 - Booster for Pfizer series) 08/03/2021 (Originally 06/18/2020)   Zoster Vaccines- Shingrix (1 of 2) 08/15/2021 (Originally [birth year]/29/1974)   Hepatitis C Screening  08/26/2021 (Originally [birth year]29/1972)   Pneumonia Vaccine 26+ Years old (1 - PCV) 05/17/2022 (Originally 05/30/2019)   TETANUS/TDAP  07/19/2022 (Originally [birth year]/29/1974)   MAMMOGRAM  04/17/2023   COLONOSCOPY (Pts 45-67yrs Insurance coverage will need to be confirmed)  06/25/2027   INFLUENZA VACCINE  Completed   DEXA SCAN  Completed   HPV VACCINES  Aged Out   Health Maintenance There are no preventive care reminders to display for this patient.  Lung Cancer Screening: (Low Dose CT Chest recommended if Age 9-80 years, 30 pack-year currently smoking OR have quit w/in  15years.) does not qualify.   Hepatitis C Screening: deferred per patient preference   Vision Screening: Recommended annual ophthalmology  exams for early detection of glaucoma and other disorders of the eye.  Dental Screening: Recommended annual dental exams for proper oral hygiene  Community Resource Referral / Chronic Care Management: CRR required this visit?  No   CCM required this visit?  No      Plan:   Keep all routine maintenance appointments.   I have personally reviewed and noted the following in the patient's chart:   Medical and social history Use of alcohol, tobacco or illicit drugs  Current medications and supplements including opioid prescriptions.  Functional ability and status Nutritional status Physical activity Advanced directives List of other physicians Hospitalizations, surgeries, and ER visits in previous 12 months Vitals Screenings to include cognitive, depression, and falls Referrals and appointments  In addition, I have reviewed and discussed with patient certain preventive protocols, quality metrics, and best practice recommendations. A written personalized care plan for preventive services as well as general preventive health recommendations were provided to patient.     Ashok Pall, LPN   1/61/0960

## 2021-09-11 ENCOUNTER — Ambulatory Visit: Payer: Medicare HMO | Admitting: Internal Medicine

## 2021-10-01 ENCOUNTER — Encounter: Payer: Self-pay | Admitting: Internal Medicine

## 2021-10-01 ENCOUNTER — Ambulatory Visit (INDEPENDENT_AMBULATORY_CARE_PROVIDER_SITE_OTHER): Payer: Medicare HMO | Admitting: Internal Medicine

## 2021-10-01 VITALS — BP 112/74 | HR 75 | Temp 97.6°F | Resp 19 | Ht 63.0 in | Wt 116.4 lb

## 2021-10-01 DIAGNOSIS — Z1231 Encounter for screening mammogram for malignant neoplasm of breast: Secondary | ICD-10-CM | POA: Diagnosis not present

## 2021-10-01 DIAGNOSIS — M810 Age-related osteoporosis without current pathological fracture: Secondary | ICD-10-CM

## 2021-10-01 DIAGNOSIS — R945 Abnormal results of liver function studies: Secondary | ICD-10-CM | POA: Diagnosis not present

## 2021-10-01 DIAGNOSIS — R232 Flushing: Secondary | ICD-10-CM

## 2021-10-01 DIAGNOSIS — N898 Other specified noninflammatory disorders of vagina: Secondary | ICD-10-CM

## 2021-10-01 DIAGNOSIS — Z800 Family history of malignant neoplasm of digestive organs: Secondary | ICD-10-CM | POA: Diagnosis not present

## 2021-10-01 DIAGNOSIS — E7800 Pure hypercholesterolemia, unspecified: Secondary | ICD-10-CM | POA: Diagnosis not present

## 2021-10-01 NOTE — Progress Notes (Signed)
Patient ID: Lisa Chen, female   DOB: Jan 27, 1956, 66 y.o.   MRN: [id number]   Subjective:    Patient ID: Lisa Chen, female    DOB: [birth year], 66 y.o.   MRN: [id number]   Patient here for a scheduled follow up.    HPI Reports she is doing relatively well.  Here to follow up regarding her cholesterol.  Also having issues with hot flashes.  Affecting sleep.  Will get hot and then cold.  Has been having some vaginal issues as well.  Previously prescribed estrace vaginal cream.  Discussed. Was using more regularly.  Has stopped.  Did feel helped with vaginal symptoms.  Discussed treatment options for hot flashes.  No chest pain or sob reported.  No abdominal pain or bowel change reported.    Past Medical History:  Diagnosis Date   Cancer ([place])    melanoma   Headache    3/WEEK, / DUE TO EYELIDS   Osteopenia    NOT PROGRESSED IN LAST 10 YEARS   Ptosis, both eyelids    UPPER   Past Surgical History:  Procedure Laterality Date   ABDOMINAL HYSTERECTOMY  1995   AUGMENTATION MAMMAPLASTY Bilateral 2000   SALINE   BREAST EXCISIONAL BIOPSY Right 1990's   NEG   BREAST SURGERY Right    BIOPSY   BROW LIFT Bilateral 04/03/2015   Procedure: BLEPHAROPLASTY;  Surgeon: Karle Starch, MD;  Location: [facility];  Service: Ophthalmology;  Laterality: Bilateral;   CERVICAL DISC SURGERY  2001   COLONOSCOPY WITH PROPOFOL N/A 06/24/2017   Procedure: COLONOSCOPY WITH PROPOFOL;  Surgeon: Robert Bellow, MD;  Location: [facility];  Service: Endoscopy;  Laterality: N/A;   COSMETIC SURGERY     SKIN CANCER-MELANOMA ON BACK REMOVED-BASAL CELL ON CHEST ALSO   EYE SURGERY Bilateral 2000 AND 2002   CATARACTS   FOOT SURGERY     HERNIA REPAIR  1984   PTOSIS REPAIR Bilateral 04/03/2015   Procedure: PTOSIS REPAIR;  Surgeon: Karle Starch, MD;  Location: [facility];  Service: Ophthalmology;  Laterality: Bilateral;   TONSILLECTOMY     Family History  Problem Relation Age of Onset    Arthritis Mother    Heart disease Mother    Diabetes Mother    Heart attack Father    Heart disease Maternal Grandmother    Heart disease Maternal Grandfather    Diabetes Maternal Grandfather    Colon cancer Cousin    Colon cancer Maternal Uncle    Heart attack Maternal Uncle    Stroke Maternal Uncle    Breast cancer Neg Hx    Social History   Socioeconomic History   Marital status: Married    Spouse name: Not on file   Number of children: Not on file   Years of education: Not on file   Highest education level: Not on file  Occupational History   Not on file  Tobacco Use   Smoking status: Never   Smokeless tobacco: Never  Vaping Use   Vaping Use: Never used  Substance and Sexual Activity   Alcohol use: Yes    Alcohol/week: 3.0 standard drinks    Types: 3 Glasses of wine per week    Comment: OCCAS. none last 24hrs   Drug use: No   Sexual activity: Not on file  Other Topics Concern   Not on file  Social History Narrative   Not on file   Social Determinants of Health   Financial Resource  Strain: Low Risk    Difficulty of Paying Living Expenses: Not hard at all  Food Insecurity: No Food Insecurity   Worried About Charity fundraiser in the Last Year: Never true   Ran Out of Food in the Last Year: Never true  Transportation Needs: No Transportation Needs   Lack of Transportation (Medical): No   Lack of Transportation (Non-Medical): No  Physical Activity: Sufficiently Active   Days of Exercise per Week: 5 days   Minutes of Exercise per Session: 30 min  Stress: No Stress Concern Present   Feeling of Stress : Not at all  Social Connections: Socially Integrated   Frequency of Communication with Friends and Family: More than three times a week   Frequency of Social Gatherings with Friends and Family: More than three times a week   Attends Religious Services: More than 4 times per year   Active Member of [employer] or Organizations: Yes   Attends Archivist  Meetings: Not on file   Marital Status: Married     Review of Systems  Constitutional:  Negative for appetite change and unexpected weight change.  HENT:  Negative for congestion and sinus pressure.   Respiratory:  Negative for cough, chest tightness and shortness of breath.   Cardiovascular:  Negative for chest pain, palpitations and leg swelling.  Gastrointestinal:  Negative for abdominal pain, diarrhea, nausea and vomiting.  Genitourinary:  Negative for difficulty urinating and dysuria.  Musculoskeletal:  Negative for joint swelling and myalgias.  Skin:  Negative for color change and rash.  Neurological:  Negative for dizziness, light-headedness and headaches.  Psychiatric/Behavioral:  Negative for agitation and dysphoric mood.        Objective:     BP 112/74 (BP Location: Left Arm, Patient Position: Sitting, Cuff Size: Small)   Pulse 75   Temp 97.6 F (36.4 C) (Temporal)   Resp 19   Ht '5\' 3"'$  (1.6 m)   Wt 116 lb 6.4 oz (52.8 kg)   SpO2 98%   BMI 20.62 kg/m  Wt Readings from Last 3 Encounters:  10/01/21 116 lb 6.4 oz (52.8 kg)  07/18/21 118 lb (53.5 kg)  05/17/21 118 lb 6.4 oz (53.7 kg)    Physical Exam Vitals reviewed.  Constitutional:      General: She is not in acute distress.    Appearance: Normal appearance.  HENT:     Head: Normocephalic and atraumatic.     Right Ear: External ear normal.     Left Ear: External ear normal.  Eyes:     General: No scleral icterus.       Right eye: No discharge.        Left eye: No discharge.     Conjunctiva/sclera: Conjunctivae normal.  Neck:     Thyroid: No thyromegaly.  Cardiovascular:     Rate and Rhythm: Normal rate and regular rhythm.  Pulmonary:     Effort: No respiratory distress.     Breath sounds: Normal breath sounds. No wheezing.  Abdominal:     General: Bowel sounds are normal.     Palpations: Abdomen is soft.     Tenderness: There is no abdominal tenderness.  Musculoskeletal:        General: No  swelling or tenderness.     Cervical back: Neck supple. No tenderness.  Lymphadenopathy:     Cervical: No cervical adenopathy.  Skin:    Findings: No erythema or rash.  Neurological:     Mental Status: She is  alert.  Psychiatric:        Mood and Affect: Mood normal.        Behavior: Behavior normal.      Outpatient Encounter Medications as of 10/01/2021  Medication Sig   estradiol (ESTRACE VAGINAL) 0.1 MG/GM vaginal cream Use one applicator q day x 5 days and then continue two times per week.   No facility-administered encounter medications on file as of 10/01/2021.     Lab Results  Component Value Date   WBC 4.3 03/11/2021   HGB 12.8 03/11/2021   HCT 38.9 03/11/2021   PLT 267.0 03/11/2021   GLUCOSE 80 03/11/2021   CHOL 245 (H) 03/11/2021   TRIG 48.0 03/11/2021   HDL 96.80 03/11/2021   LDLCALC 139 (H) 03/11/2021   ALT 16 04/08/2021   AST 22 04/08/2021   NA 139 03/11/2021   K 4.0 03/11/2021   CL 103 03/11/2021   CREATININE 0.70 03/11/2021   BUN 19 03/11/2021   CO2 28 03/11/2021   TSH 1.98 03/11/2021    DG Bone Density  Result Date: 04/16/2021 EXAM: DUAL X-RAY ABSORPTIOMETRY (DXA) FOR BONE MINERAL DENSITY IMPRESSION: Your patient Mabeline Varas completed a BMD test on 04/16/2021 using the [place] (software version: 14.10) manufactured by [employer]. The following summarizes the results of our evaluation. Technologist: SCE PATIENT BIOGRAPHICAL: Name: Dajanique, Robley Patient ID: [id number] Birth Date: 1955-09-20 Height: 63.0 in. Gender: Female Exam Date: 04/16/2021 Weight: 118.0 lbs. Indications: Caucasian, Hysterectomy, Oophorectomy Bilateral, Osteopenia, Postmenopausal Fractures: Treatments: Vit D DENSITOMETRY RESULTS: Site      Region     Measured Date Measured Age WHO Classification Young Adult T-score BMD         %Change vs. Previous Significant Change (*) AP Spine L1-L4 (L3) 04/16/2020 66.8 Osteoporosis -3.2 0.796 g/cm2 -19.0% Yes AP Spine  L1-L4 (L3) 08/16/2015 61.2 Osteopenia -1.6 0.983 g/cm2 6.4% Yes AP Spine L1-L4 (L3) 05/07/2010 55.9 Osteopenia -2.1 0.924 g/cm2 - - DualFemur Neck Left 04/16/2021 66.8 Osteopenia -1.5 0.835 g/cm2 -4.9% - DualFemur Neck Left 08/16/2015 61.2 Osteopenia -1.2 0.878 g/cm2 -3.3% - DualFemur Neck Left 05/07/2010 55.9 Normal -0.9 0.908 g/cm2 - - DualFemur Total Mean 04/16/2021 66.8 Osteopenia -1.3 0.843 g/cm2 -7.0% Yes DualFemur Total Mean 08/16/2015 61.2 Normal -0.8 0.906 g/cm2 0.0% - DualFemur Total Mean 05/07/2010 55.9 Normal -0.8 0.906 g/cm2 - - ASSESSMENT: The BMD measured at AP Spine L1-L4 (L3) is 0.796 g/cm2 with a T-score of -3.2. This patient is considered osteoporotic according to [facility]) criteria. The scan quality is good. L3 was excluded due to degenerative changes. Compared with prior study, there has been a significant decrease in the spine. Compared with prior study, there has been a significant decrease in the total hip. [facility] Pharmac[employer] [facility]) criteria for post-menopausal, Caucasian Women: Normal:                   T-score at or above -1 SD Osteopenia/low bone mass: T-score between -1 and -2.5 SD Osteoporosis:             T-score at or below -2.5 SD RECOMMENDATIONS: 1. All patients should optimize calcium and vitamin D intake. 2. Consider FDA-approved medical therapies in postmenopausal women and men aged 29 years and older, based on the following: a. A hip or vertebral(clinical or morphometric) fracture b. T-score < -2.5 at the femoral neck or spine after appropriate evaluation to exclude secondary causes c. Low bone mass (T-score between -1.0 and -2.5 at  the femoral neck or spine) and a 10-year probability of a hip fracture > 3% or a 10-year probability of a major osteoporosis-related fracture > 20% based on the [place]-adapted WHO algorithm 3. Clinician judgment and/or patient preferences may indicate treatment for people with 10-year fracture probabilities above or below these  levels FOLLOW-UP: People with diagnosed cases of osteoporosis or at high risk for fracture should have regular bone mineral density tests. For patients eligible for Medicare, routine testing is allowed once every 2 years. The testing frequency can be increased to one year for patients who have rapidly progressing disease, those who are receiving or discontinuing medical therapy to restore bone mass, or have additional risk factors. I have reviewed this report, and agree with the above findings. Mark A. Thornton Papas, M.D. [facility] Electronically Signed   By: Lavonia Dana M.D.   On: 04/16/2021 12:19   MM 3D SCREEN BREAST W/IMPLANT BILATERAL  Result Date: 04/16/2021 CLINICAL DATA:  Screening. EXAM: DIGITAL SCREENING BILATERAL MAMMOGRAM WITH IMPLANTS, CAD AND TOMOSYNTHESIS TECHNIQUE: Bilateral screening digital craniocaudal and mediolateral oblique mammograms were obtained. Bilateral screening digital breast tomosynthesis was performed. The images were evaluated with computer-aided detection. Standard and/or implant displaced views were performed. COMPARISON:  Previous exam(s). ACR Breast Density Category b: There are scattered areas of fibroglandular density. FINDINGS: The patient has retropectoral implants. There are no findings suspicious for malignancy. IMPRESSION: No mammographic evidence of malignancy. A result letter of this screening mammogram will be mailed directly to the patient. RECOMMENDATION: Screening mammogram in one year. (Code:SM-B-01Y) BI-RADS CATEGORY  1:  Negative. Electronically Signed   By: Audie Pinto M.D.   On: 04/16/2021 16:02      Assessment & Plan:   Problem List Items Addressed This Visit     Hypercholesterolemia - Primary   Other Visit Diagnoses     Abnormal liver function            Einar Pheasant, MD

## 2021-10-02 LAB — BASIC METABOLIC PANEL
BUN: 20 mg/dL (ref 6–23)
CO2: 26 mEq/L (ref 19–32)
Calcium: 9.6 mg/dL (ref 8.4–10.5)
Chloride: 103 mEq/L (ref 96–112)
Creatinine, Ser: 0.68 mg/dL (ref 0.40–1.20)
GFR: 90.17 mL/min (ref >60.00)
Glucose, Bld: 83 mg/dL (ref 70–99)
Potassium: 4.2 mEq/L (ref 3.5–5.1)
Sodium: 139 mEq/L (ref 135–145)

## 2021-10-02 LAB — HEPATIC FUNCTION PANEL
ALT: 15 U/L (ref 0–35)
AST: 17 U/L (ref 0–37)
Albumin: 4.4 g/dL (ref 3.5–5.2)
Alkaline Phosphatase: 62 U/L (ref 39–117)
Bilirubin, Direct: 0.1 mg/dL (ref 0.0–0.3)
Total Bilirubin: 1 mg/dL (ref 0.2–1.2)
Total Protein: 6.9 g/dL (ref 6.0–8.3)

## 2021-10-02 LAB — LIPID PANEL
Cholesterol: 246 mg/dL — ABNORMAL HIGH (ref 0–200)
HDL: 81.3 mg/dL (ref >39.00)
LDL Cholesterol: 150 mg/dL — ABNORMAL HIGH (ref 0–99)
NonHDL: 164.65
Total CHOL/HDL Ratio: 3
Triglycerides: 75 mg/dL (ref 0.0–149.0)
VLDL: 15 mg/dL (ref 0.0–40.0)

## 2021-10-03 ENCOUNTER — Encounter: Payer: Self-pay | Admitting: Internal Medicine

## 2021-10-03 DIAGNOSIS — Z1239 Encounter for other screening for malignant neoplasm of breast: Secondary | ICD-10-CM | POA: Insufficient documentation

## 2021-10-03 NOTE — Assessment & Plan Note (Signed)
Mammogram 04/16/21 - Birads I.

## 2021-10-03 NOTE — Assessment & Plan Note (Signed)
Discussed estrace cream.  Tolerating.

## 2021-10-03 NOTE — Assessment & Plan Note (Signed)
Off estrogen.  Have discussed weight bearing exercise, calcium and vitamin D.   She will notify if desires to start prescription medication.

## 2021-10-03 NOTE — Assessment & Plan Note (Signed)
calcium score 0.  Low cholesterol diet and exercise.  Follow lipid panel.

## 2021-10-03 NOTE — Assessment & Plan Note (Addendum)
Colonoscopy 05/2017 - diverticulosis.  Recommended f/u in 10 years. States - maternal uncle with colon cancer.   

## 2021-10-03 NOTE — Assessment & Plan Note (Signed)
Off estrogen.  Discussed with her today.  Discussed treatment options.  Discussed vitamin E.  Discussed being placed back on estrogen.  Discussed a trial of effexor.  Will notify me if desires to start medication.

## 2022-02-19 ENCOUNTER — Telehealth: Payer: Self-pay | Admitting: Internal Medicine

## 2022-02-19 DIAGNOSIS — M810 Age-related osteoporosis without current pathological fracture: Secondary | ICD-10-CM

## 2022-02-19 DIAGNOSIS — E7800 Pure hypercholesterolemia, unspecified: Secondary | ICD-10-CM

## 2022-02-19 DIAGNOSIS — Z1231 Encounter for screening mammogram for malignant neoplasm of breast: Secondary | ICD-10-CM

## 2022-02-19 NOTE — Telephone Encounter (Signed)
Notify pt that her mammogram is scheduled at [facility] 04/17/22 at 2:20 pm.  Also, lab orders are in, please schedule fasting labs 1-2 days before her appt

## 2022-02-19 NOTE — Telephone Encounter (Signed)
Patient also needs a referral for a mammogram.

## 2022-02-19 NOTE — Telephone Encounter (Signed)
Patient is coming in on 03/14/2022 for a CPE. She would like to do labs before appt, no orders.

## 2022-02-19 NOTE — Addendum Note (Signed)
Addended by: Alisa Graff on: 02/19/2022 02:31 PM   Modules accepted: Orders

## 2022-03-11 ENCOUNTER — Other Ambulatory Visit (INDEPENDENT_AMBULATORY_CARE_PROVIDER_SITE_OTHER): Payer: Medicare HMO

## 2022-03-11 DIAGNOSIS — M810 Age-related osteoporosis without current pathological fracture: Secondary | ICD-10-CM

## 2022-03-11 DIAGNOSIS — E7800 Pure hypercholesterolemia, unspecified: Secondary | ICD-10-CM

## 2022-03-11 LAB — CBC WITH DIFFERENTIAL/PLATELET
Basophils Absolute: 0 10*3/uL (ref 0.0–0.1)
Basophils Relative: 0.6 % (ref 0.0–3.0)
Eosinophils Absolute: 0.1 10*3/uL (ref 0.0–0.7)
Eosinophils Relative: 2.7 % (ref 0.0–5.0)
HCT: 37.5 % (ref 36.0–46.0)
Hemoglobin: 12.7 g/dL (ref 12.0–15.0)
Lymphocytes Relative: 34.6 % (ref 12.0–46.0)
Lymphs Abs: 1.7 10*3/uL (ref 0.7–4.0)
MCHC: 33.8 g/dL (ref 30.0–36.0)
MCV: 92.8 fl (ref 78.0–100.0)
Monocytes Absolute: 0.6 10*3/uL (ref 0.1–1.0)
Monocytes Relative: 11.7 % (ref 3.0–12.0)
Neutro Abs: 2.5 10*3/uL (ref 1.4–7.7)
Neutrophils Relative %: 50.4 % (ref 43.0–77.0)
Platelets: 304 10*3/uL (ref 150.0–400.0)
RBC: 4.05 Mil/uL (ref 3.87–5.11)
RDW: 12.2 % (ref 11.5–15.5)
WBC: 5 10*3/uL (ref 4.0–10.5)

## 2022-03-11 LAB — BASIC METABOLIC PANEL
BUN: 18 mg/dL (ref 6–23)
CO2: 30 mEq/L (ref 19–32)
Calcium: 9.3 mg/dL (ref 8.4–10.5)
Chloride: 105 mEq/L (ref 96–112)
Creatinine, Ser: 0.7 mg/dL (ref 0.40–1.20)
GFR: 89.26 mL/min (ref >60.00)
Glucose, Bld: 91 mg/dL (ref 70–99)
Potassium: 4.5 mEq/L (ref 3.5–5.1)
Sodium: 140 mEq/L (ref 135–145)

## 2022-03-11 LAB — HEPATIC FUNCTION PANEL
ALT: 17 U/L (ref 0–35)
AST: 24 U/L (ref 0–37)
Albumin: 4.3 g/dL (ref 3.5–5.2)
Alkaline Phosphatase: 68 U/L (ref 39–117)
Bilirubin, Direct: 0.2 mg/dL (ref 0.0–0.3)
Total Bilirubin: 1.1 mg/dL (ref 0.2–1.2)
Total Protein: 6.9 g/dL (ref 6.0–8.3)

## 2022-03-11 LAB — LIPID PANEL
Cholesterol: 222 mg/dL — ABNORMAL HIGH (ref 0–200)
HDL: 80 mg/dL (ref >39.00)
LDL Cholesterol: 131 mg/dL — ABNORMAL HIGH (ref 0–99)
NonHDL: 142.4
Total CHOL/HDL Ratio: 3
Triglycerides: 55 mg/dL (ref 0.0–149.0)
VLDL: 11 mg/dL (ref 0.0–40.0)

## 2022-03-11 LAB — TSH: TSH: 2.36 u[IU]/mL (ref 0.35–5.50)

## 2022-03-11 LAB — VITAMIN D 25 HYDROXY (VIT D DEFICIENCY, FRACTURES): VITD: 40.72 ng/mL (ref 30.00–100.00)

## 2022-03-12 ENCOUNTER — Other Ambulatory Visit: Payer: Medicare HMO

## 2022-03-14 ENCOUNTER — Encounter: Payer: Self-pay | Admitting: Internal Medicine

## 2022-03-14 ENCOUNTER — Ambulatory Visit (INDEPENDENT_AMBULATORY_CARE_PROVIDER_SITE_OTHER): Payer: Medicare HMO | Admitting: Internal Medicine

## 2022-03-14 VITALS — BP 118/68 | HR 73 | Temp 97.8°F | Resp 14 | Ht 63.0 in | Wt 117.8 lb

## 2022-03-14 DIAGNOSIS — N898 Other specified noninflammatory disorders of vagina: Secondary | ICD-10-CM

## 2022-03-14 DIAGNOSIS — Z0000 Encounter for general adult medical examination without abnormal findings: Secondary | ICD-10-CM

## 2022-03-14 DIAGNOSIS — Z0001 Encounter for general adult medical examination with abnormal findings: Secondary | ICD-10-CM

## 2022-03-14 DIAGNOSIS — S90229D Contusion of unspecified lesser toe(s) with damage to nail, subsequent encounter: Secondary | ICD-10-CM

## 2022-03-14 DIAGNOSIS — R232 Flushing: Secondary | ICD-10-CM

## 2022-03-14 DIAGNOSIS — M810 Age-related osteoporosis without current pathological fracture: Secondary | ICD-10-CM | POA: Diagnosis not present

## 2022-03-14 DIAGNOSIS — Z800 Family history of malignant neoplasm of digestive organs: Secondary | ICD-10-CM | POA: Diagnosis not present

## 2022-03-14 DIAGNOSIS — E7800 Pure hypercholesterolemia, unspecified: Secondary | ICD-10-CM | POA: Diagnosis not present

## 2022-03-14 NOTE — Progress Notes (Unsigned)
Patient ID: Lisa Chen, female   DOB: 1955-09-22, 66 y.o.   MRN: [id number]   Subjective:    Patient ID: Lisa Chen, female    DOB: 06-29-1955, 66 y.o.   MRN: [id number]   Patient here for  Chief Complaint  Patient presents with   Annual Exam    CPE    .   HPI Reports she is doing relatively well.  Tries to stay active.  No chest pain or sob reported.  Eating.  No nausea or vomiting.  Bowels moving.  Persistent issue with hot flashes.  Discussed treatment options.  She has vaginal estrogen.  Does affect sleep.  Some occasional nocturia.  Dark - bilateral toenails (great toes).  No known injury or trauma.  Noticed after increased walking.     Past Medical History:  Diagnosis Date   Cancer ([place])    melanoma   Headache    3/WEEK, / DUE TO EYELIDS   Osteopenia    NOT PROGRESSED IN LAST 10 YEARS   Ptosis, both eyelids    UPPER   Past Surgical History:  Procedure Laterality Date   ABDOMINAL HYSTERECTOMY  1995   AUGMENTATION MAMMAPLASTY Bilateral 2000   SALINE   BREAST EXCISIONAL BIOPSY Right 1990's   NEG   BREAST SURGERY Right    BIOPSY   BROW LIFT Bilateral 04/03/2015   Procedure: BLEPHAROPLASTY;  Surgeon: Karle Starch, MD;  Location: [facility];  Service: Ophthalmology;  Laterality: Bilateral;   CERVICAL DISC SURGERY  2001   COLONOSCOPY WITH PROPOFOL N/A 06/24/2017   Procedure: COLONOSCOPY WITH PROPOFOL;  Surgeon: Robert Bellow, MD;  Location: [facility];  Service: Endoscopy;  Laterality: N/A;   COSMETIC SURGERY     SKIN CANCER-MELANOMA ON BACK REMOVED-BASAL CELL ON CHEST ALSO   EYE SURGERY Bilateral 2000 AND 2002   CATARACTS   FOOT SURGERY     HERNIA REPAIR  1984   PTOSIS REPAIR Bilateral 04/03/2015   Procedure: PTOSIS REPAIR;  Surgeon: Karle Starch, MD;  Location: [facility];  Service: Ophthalmology;  Laterality: Bilateral;   TONSILLECTOMY     Family History  Problem Relation Age of Onset   Arthritis Mother    Heart disease  Mother    Diabetes Mother    Heart attack Father    Heart disease Maternal Grandmother    Heart disease Maternal Grandfather    Diabetes Maternal Grandfather    Colon cancer Cousin    Colon cancer Maternal Uncle    Heart attack Maternal Uncle    Stroke Maternal Uncle    Breast cancer Neg Hx    Social History   Socioeconomic History   Marital status: Married    Spouse name: Not on file   Number of children: Not on file   Years of education: Not on file   Highest education level: Not on file  Occupational History   Not on file  Tobacco Use   Smoking status: Never   Smokeless tobacco: Never  Vaping Use   Vaping Use: Never used  Substance and Sexual Activity   Alcohol use: Yes    Alcohol/week: 3.0 standard drinks of alcohol    Types: 3 Glasses of wine per week    Comment: OCCAS. none last 24hrs   Drug use: No   Sexual activity: Not on file  Other Topics Concern   Not on file  Social History Narrative   Not on file   Social Determinants of Health  Financial Resource Strain: Low Risk  (07/18/2021)   Overall Financial Resource Strain (CARDIA)    Difficulty of Paying Living Expenses: Not hard at all  Food Insecurity: No Food Insecurity (07/18/2021)   Hunger Vital Sign    Worried About Running Out of Food in the Last Year: Never true    Ran Out of Food in the Last Year: Never true  Transportation Needs: No Transportation Needs (07/18/2021)   PRAPARE - Hydrologist (Medical): No    Lack of Transportation (Non-Medical): No  Physical Activity: Sufficiently Active (07/18/2021)   Exercise Vital Sign    Days of Exercise per Week: 5 days    Minutes of Exercise per Session: 30 min  Stress: No Stress Concern Present (07/18/2021)   [place]    Feeling of Stress : Not at all  Social Connections: [place] (07/18/2021)   Social Connection and Isolation Panel [NHANES]     Frequency of Communication with Friends and Family: More than three times a week    Frequency of Social Gatherings with Friends and Family: More than three times a week    Attends Religious Services: More than 4 times per year    Active Member of [employer] or Organizations: Yes    Attends Archivist Meetings: Not on file    Marital Status: Married     Review of Systems  Constitutional:  Negative for appetite change and unexpected weight change.  HENT:  Negative for congestion, sinus pressure and sore throat.   Eyes:  Negative for pain and visual disturbance.  Respiratory:  Negative for cough, chest tightness and shortness of breath.   Cardiovascular:  Negative for chest pain, palpitations and leg swelling.  Gastrointestinal:  Negative for abdominal pain, diarrhea, nausea and vomiting.  Genitourinary:  Negative for difficulty urinating and dysuria.  Musculoskeletal:  Negative for back pain and joint swelling.  Skin:  Negative for color change and rash.  Neurological:  Negative for dizziness and headaches.  Hematological:  Negative for adenopathy. Does not bruise/bleed easily.  Psychiatric/Behavioral:  Negative for agitation and dysphoric mood.        Objective:     BP 118/68 (BP Location: Left Arm, Patient Position: Sitting, Cuff Size: Small)   Pulse 73   Temp 97.8 F (36.6 C) (Temporal)   Resp 14   Ht '5\' 3"'$  (1.6 m)   Wt 117 lb 12.8 oz (53.4 kg)   SpO2 98%   BMI 20.87 kg/m  Wt Readings from Last 3 Encounters:  03/14/22 117 lb 12.8 oz (53.4 kg)  10/01/21 116 lb 6.4 oz (52.8 kg)  07/18/21 118 lb (53.5 kg)    Physical Exam Vitals reviewed.  Constitutional:      General: She is not in acute distress.    Appearance: Normal appearance. She is well-developed.  HENT:     Head: Normocephalic and atraumatic.     Right Ear: External ear normal.     Left Ear: External ear normal.  Eyes:     General: No scleral icterus.       Right eye: No discharge.        Left eye:  No discharge.     Conjunctiva/sclera: Conjunctivae normal.  Neck:     Thyroid: No thyromegaly.  Cardiovascular:     Rate and Rhythm: Normal rate and regular rhythm.  Pulmonary:     Effort: No tachypnea, accessory muscle usage or respiratory distress.  Breath sounds: Normal breath sounds. No decreased breath sounds or wheezing.  Chest:  Breasts:    Right: No inverted nipple, mass, nipple discharge or tenderness (no axillary adenopathy).     Left: No inverted nipple, mass, nipple discharge or tenderness (no axilarry adenopathy).  Abdominal:     General: Bowel sounds are normal.     Palpations: Abdomen is soft.     Tenderness: There is no abdominal tenderness.  Musculoskeletal:        General: No swelling or tenderness.     Cervical back: Neck supple.  Lymphadenopathy:     Cervical: No cervical adenopathy.  Skin:    Findings: No erythema or rash.  Neurological:     Mental Status: She is alert and oriented to person, place, and time.  Psychiatric:        Mood and Affect: Mood normal.        Behavior: Behavior normal.      Outpatient Encounter Medications as of 03/14/2022  Medication Sig   estradiol (ESTRACE VAGINAL) 0.1 MG/GM vaginal cream Use one applicator q day x 5 days and then continue two times per week.   [DISCONTINUED] estradiol (ESTRACE VAGINAL) 0.1 MG/GM vaginal cream Use one applicator q day x 5 days and then continue two times per week.   No facility-administered encounter medications on file as of 03/14/2022.     Lab Results  Component Value Date   WBC 5.0 03/11/2022   HGB 12.7 03/11/2022   HCT 37.5 03/11/2022   PLT 304.0 03/11/2022   GLUCOSE 91 03/11/2022   CHOL 222 (H) 03/11/2022   TRIG 55.0 03/11/2022   HDL 80.00 03/11/2022   LDLCALC 131 (H) 03/11/2022   ALT 17 03/11/2022   AST 24 03/11/2022   NA 140 03/11/2022   K 4.5 03/11/2022   CL 105 03/11/2022   CREATININE 0.70 03/11/2022   BUN 18 03/11/2022   CO2 30 03/11/2022   TSH 2.36 03/11/2022     MM 3D SCREEN BREAST W/IMPLANT BILATERAL  Result Date: 04/16/2021 CLINICAL DATA:  Screening. EXAM: DIGITAL SCREENING BILATERAL MAMMOGRAM WITH IMPLANTS, CAD AND TOMOSYNTHESIS TECHNIQUE: Bilateral screening digital craniocaudal and mediolateral oblique mammograms were obtained. Bilateral screening digital breast tomosynthesis was performed. The images were evaluated with computer-aided detection. Standard and/or implant displaced views were performed. COMPARISON:  Previous exam(s). ACR Breast Density Category b: There are scattered areas of fibroglandular density. FINDINGS: The patient has retropectoral implants. There are no findings suspicious for malignancy. IMPRESSION: No mammographic evidence of malignancy. A result letter of this screening mammogram will be mailed directly to the patient. RECOMMENDATION: Screening mammogram in one year. (Code:SM-B-01Y) BI-RADS CATEGORY  1:  Negative. Electronically Signed   By: Audie Pinto M.D.   On: 04/16/2021 16:02  DG Bone Density  Result Date: 04/16/2021 EXAM: DUAL X-RAY ABSORPTIOMETRY (DXA) FOR BONE MINERAL DENSITY IMPRESSION: Your patient Kariana Wiles completed a BMD test on 04/16/2021 using the [employer] (software version: 14.10) manufactured by [employer]. The following summarizes the results of our evaluation. Technologist: SCE PATIENT BIOGRAPHICAL: Name: Burnice, Vassel Patient ID: [id number] Birth Date: 1955-12-03 Height: 63.0 in. Gender: Female Exam Date: 04/16/2021 Weight: 118.0 lbs. Indications: Caucasian, Hysterectomy, Oophorectomy Bilateral, Osteopenia, Postmenopausal Fractures: Treatments: Vit D DENSITOMETRY RESULTS: Site      Region     Measured Date Measured Age WHO Classification Young Adult T-score BMD         %Change vs. Previous Significant Change (*) AP Spine L1-L4 (L3) 04/16/2020  66.8 Osteoporosis -3.2 0.796 g/cm2 -19.0% Yes AP Spine L1-L4 (L3) 08/16/2015 61.2 Osteopenia -1.6 0.983 g/cm2 6.4% Yes AP Spine  L1-L4 (L3) 05/07/2010 55.9 Osteopenia -2.1 0.924 g/cm2 - - DualFemur Neck Left 04/16/2021 66.8 Osteopenia -1.5 0.835 g/cm2 -4.9% - DualFemur Neck Left 08/16/2015 61.2 Osteopenia -1.2 0.878 g/cm2 -3.3% - DualFemur Neck Left 05/07/2010 55.9 Normal -0.9 0.908 g/cm2 - - DualFemur Total Mean 04/16/2021 66.8 Osteopenia -1.3 0.843 g/cm2 -7.0% Yes DualFemur Total Mean 08/16/2015 61.2 Normal -0.8 0.906 g/cm2 0.0% - DualFemur Total Mean 05/07/2010 55.9 Normal -0.8 0.906 g/cm2 - - ASSESSMENT: The BMD measured at AP Spine L1-L4 (L3) is 0.796 g/cm2 with a T-score of -3.2. This patient is considered osteoporotic according to [facility]) criteria. The scan quality is good. L3 was excluded due to degenerative changes. Compared with prior study, there has been a significant decrease in the spine. Compared with prior study, there has been a significant decrease in the total hip. World Pharmacologist [facility]) criteria for post-menopausal, Caucasian Women: Normal:                   T-score at or above -1 SD Osteopenia/low bone mass: T-score between -1 and -2.5 SD Osteoporosis:             T-score at or below -2.5 SD RECOMMENDATIONS: 1. All patients should optimize calcium and vitamin D intake. 2. Consider FDA-approved medical therapies in postmenopausal women and men aged 72 years and older, based on the following: a. A hip or vertebral(clinical or morphometric) fracture b. T-score < -2.5 at the femoral neck or spine after appropriate evaluation to exclude secondary causes c. Low bone mass (T-score between -1.0 and -2.5 at the femoral neck or spine) and a 10-year probability of a hip fracture > 3% or a 10-year probability of a major osteoporosis-related fracture > 20% based on the [place]-adapted WHO algorithm 3. Clinician judgment and/or patient preferences may indicate treatment for people with 10-year fracture probabilities above or below these levels FOLLOW-UP: People with diagnosed cases of osteoporosis or at high  risk for fracture should have regular bone mineral density tests. For patients eligible for Medicare, routine testing is allowed once every 2 years. The testing frequency can be increased to one year for patients who have rapidly progressing disease, those who are receiving or discontinuing medical therapy to restore bone mass, or have additional risk factors. I have reviewed this report, and agree with the above findings. Mark A. Thornton Papas, M.D. [facility] Electronically Signed   By: Lavonia Dana M.D.   On: 04/16/2021 12:19       Assessment & Plan:   Problem List Items Addressed This Visit     Family history of colon cancer    Colonoscopy 05/2017 - diverticulosis.  Recommended f/u in 10 years. States - maternal uncle with colon cancer.        Health care maintenance - Primary    Physical today 03/14/22.  PAP 03/11/21 - negative HPV. Atrophy.  Mammogram 04/16/21 - Birads I. Scheduled for f/u mammogram. 04/17/22.  Colonoscopy 05/2017.  Recommended f/du in 10 years.       Hot flashes    Off oral estrogen.  Discussed with her today.  Discussed treatment options.  Discussed being placed back on estrogen.  Discussed a trial of effexor.  Will notify me if desires to start medication.       Hypercholesterolemia    The 10-year ASCVD risk score (Arnett DK, et al., 2019)  is: 5.5%   Values used to calculate the score:     Age: 89 years     Sex: Female     Is Non-Hispanic African American: No     Diabetic: No     Tobacco smoker: No     Systolic Blood Pressure: 321 mmHg     Is BP treated: No     HDL Cholesterol: 80 mg/dL     Total Cholesterol: 222 mg/dL  Low cholesterol diet and exercise.  Follow lipid panel.       Osteoporosis    Off estrogen.  Have discussed weight bearing exercise, calcium and vitamin D.   She will notify if desires to start prescription medication.  Has decided may want to start reclast after first of year.        Toenail bruise    Bilateral great toenails -  dark area. Appears to be c/w trauma, but denies any trauma.  Is growing out.  Discussed referral to podiatry for evaluation to confirm etiology.  Wants to monitor.  No pain.  Follow.       Vaginal dryness    Continue vaginal estrogen.         Einar Pheasant, MD

## 2022-03-14 NOTE — Patient Instructions (Signed)
Prevnar 20

## 2022-03-14 NOTE — Assessment & Plan Note (Signed)
Physical today 03/14/22.  PAP 03/11/21 - negative HPV. Atrophy.  Mammogram 04/16/21 - Birads I. Scheduled for f/u mammogram. 04/17/22.  Colonoscopy 05/2017.  Recommended f/du in 10 years.

## 2022-03-14 NOTE — Assessment & Plan Note (Signed)
The 10-year ASCVD risk score (Arnett DK, et al., 2019) is: 5.5%   Values used to calculate the score:     Age: 66 years     Sex: Female     Is Non-Hispanic African American: No     Diabetic: No     Tobacco smoker: No     Systolic Blood Pressure: 024 mmHg     Is BP treated: No     HDL Cholesterol: 80 mg/dL     Total Cholesterol: 222 mg/dL  Low cholesterol diet and exercise.  Follow lipid panel.

## 2022-03-16 ENCOUNTER — Encounter: Payer: Self-pay | Admitting: Internal Medicine

## 2022-03-16 DIAGNOSIS — S90229A Contusion of unspecified lesser toe(s) with damage to nail, initial encounter: Secondary | ICD-10-CM | POA: Insufficient documentation

## 2022-03-16 MED ORDER — ESTRADIOL 0.1 MG/GM VA CREA: TOPICAL_CREAM | VAGINAL | 2 refills | Status: DC

## 2022-03-16 NOTE — Assessment & Plan Note (Signed)
Off oral estrogen.  Discussed with her today.  Discussed treatment options.  Discussed being placed back on estrogen.  Discussed a trial of effexor.  Will notify me if desires to start medication.

## 2022-03-16 NOTE — Assessment & Plan Note (Signed)
Off estrogen.  Have discussed weight bearing exercise, calcium and vitamin D.   She will notify if desires to start prescription medication.  Has decided may want to start reclast after first of year.

## 2022-03-16 NOTE — Assessment & Plan Note (Signed)
Bilateral great toenails - dark area. Appears to be c/w trauma, but denies any trauma.  Is growing out.  Discussed referral to podiatry for evaluation to confirm etiology.  Wants to monitor.  No pain.  Follow.

## 2022-03-16 NOTE — Assessment & Plan Note (Signed)
Continue vaginal estrogen 

## 2022-03-16 NOTE — Assessment & Plan Note (Signed)
Colonoscopy 05/2017 - diverticulosis.  Recommended f/u in 10 years. States - maternal uncle with colon cancer.

## 2022-04-17 ENCOUNTER — Ambulatory Visit
Admission: RE | Admit: 2022-04-17 | Discharge: 2022-04-17 | Disposition: A | Discharge status: Home / Self Care | Payer: Medicare HMO | Source: Ambulatory Visit | Attending: Internal Medicine | Admitting: Internal Medicine

## 2022-04-17 DIAGNOSIS — Z1231 Encounter for screening mammogram for malignant neoplasm of breast: Secondary | ICD-10-CM | POA: Insufficient documentation

## 2022-04-18 ENCOUNTER — Other Ambulatory Visit: Payer: Self-pay | Admitting: Internal Medicine

## 2022-04-18 DIAGNOSIS — N6489 Other specified disorders of breast: Secondary | ICD-10-CM

## 2022-04-18 DIAGNOSIS — R928 Other abnormal and inconclusive findings on diagnostic imaging of breast: Secondary | ICD-10-CM

## 2022-04-18 DIAGNOSIS — N63 Unspecified lump in unspecified breast: Secondary | ICD-10-CM

## 2022-04-30 ENCOUNTER — Ambulatory Visit
Admission: RE | Admit: 2022-04-30 | Discharge: 2022-04-30 | Disposition: A | Discharge status: Home / Self Care | Payer: Medicare HMO | Source: Ambulatory Visit | Attending: Internal Medicine | Admitting: Internal Medicine

## 2022-04-30 DIAGNOSIS — N6489 Other specified disorders of breast: Secondary | ICD-10-CM | POA: Diagnosis not present

## 2022-04-30 DIAGNOSIS — N6011 Diffuse cystic mastopathy of right breast: Secondary | ICD-10-CM | POA: Diagnosis not present

## 2022-04-30 DIAGNOSIS — N63 Unspecified lump in unspecified breast: Secondary | ICD-10-CM | POA: Insufficient documentation

## 2022-04-30 DIAGNOSIS — N6321 Unspecified lump in the left breast, upper outer quadrant: Secondary | ICD-10-CM | POA: Diagnosis not present

## 2022-04-30 DIAGNOSIS — R928 Other abnormal and inconclusive findings on diagnostic imaging of breast: Secondary | ICD-10-CM | POA: Diagnosis not present

## 2022-04-30 DIAGNOSIS — N6323 Unspecified lump in the left breast, lower outer quadrant: Secondary | ICD-10-CM | POA: Diagnosis not present

## 2022-05-01 ENCOUNTER — Other Ambulatory Visit: Payer: Self-pay | Admitting: Internal Medicine

## 2022-05-01 DIAGNOSIS — R928 Other abnormal and inconclusive findings on diagnostic imaging of breast: Secondary | ICD-10-CM

## 2022-05-01 DIAGNOSIS — N63 Unspecified lump in unspecified breast: Secondary | ICD-10-CM

## 2022-05-13 ENCOUNTER — Ambulatory Visit
Admission: RE | Admit: 2022-05-13 | Discharge: 2022-05-13 | Disposition: A | Discharge status: Home / Self Care | Payer: Medicare HMO | Source: Ambulatory Visit | Attending: Internal Medicine | Admitting: Internal Medicine

## 2022-05-13 DIAGNOSIS — R928 Other abnormal and inconclusive findings on diagnostic imaging of breast: Secondary | ICD-10-CM | POA: Diagnosis not present

## 2022-05-13 DIAGNOSIS — N63 Unspecified lump in unspecified breast: Secondary | ICD-10-CM | POA: Diagnosis not present

## 2022-05-13 DIAGNOSIS — N6342 Unspecified lump in left breast, subareolar: Secondary | ICD-10-CM | POA: Diagnosis not present

## 2022-05-13 DIAGNOSIS — N6082 Other benign mammary dysplasias of left breast: Secondary | ICD-10-CM | POA: Diagnosis not present

## 2022-05-13 HISTORY — PX: BREAST BIOPSY: SHX20

## 2022-05-13 MED ORDER — LIDOCAINE HCL (PF) 1 % IJ SOLN
3.0000 mL | Freq: Once | INTRAMUSCULAR | Status: AC
  Administered 2022-05-13: 3 mL

## 2022-05-13 MED ORDER — LIDOCAINE-EPINEPHRINE 1 %-1:100000 IJ SOLN
5.0000 mL | Freq: Once | INTRAMUSCULAR | Status: AC
  Administered 2022-05-13: 5 mL

## 2022-05-14 LAB — SURGICAL PATHOLOGY

## 2022-07-18 DIAGNOSIS — D2261 Melanocytic nevi of right upper limb, including shoulder: Secondary | ICD-10-CM | POA: Diagnosis not present

## 2022-07-18 DIAGNOSIS — Z85828 Personal history of other malignant neoplasm of skin: Secondary | ICD-10-CM | POA: Diagnosis not present

## 2022-07-18 DIAGNOSIS — D225 Melanocytic nevi of trunk: Secondary | ICD-10-CM | POA: Diagnosis not present

## 2022-07-18 DIAGNOSIS — D2272 Melanocytic nevi of left lower limb, including hip: Secondary | ICD-10-CM | POA: Diagnosis not present

## 2022-07-18 DIAGNOSIS — D2262 Melanocytic nevi of left upper limb, including shoulder: Secondary | ICD-10-CM | POA: Diagnosis not present

## 2022-07-18 DIAGNOSIS — R202 Paresthesia of skin: Secondary | ICD-10-CM | POA: Diagnosis not present

## 2022-07-18 DIAGNOSIS — Z85820 Personal history of malignant melanoma of skin: Secondary | ICD-10-CM | POA: Diagnosis not present

## 2022-07-18 DIAGNOSIS — S90112A Contusion of left great toe without damage to nail, initial encounter: Secondary | ICD-10-CM | POA: Diagnosis not present

## 2022-08-04 ENCOUNTER — Ambulatory Visit (INDEPENDENT_AMBULATORY_CARE_PROVIDER_SITE_OTHER): Payer: Medicare HMO

## 2022-08-04 VITALS — Ht 63.0 in | Wt 117.0 lb

## 2022-08-04 DIAGNOSIS — Z0000 Encounter for general adult medical examination without abnormal findings: Secondary | ICD-10-CM | POA: Diagnosis not present

## 2022-08-04 NOTE — Patient Instructions (Addendum)
Lisa Chen , Thank you for taking time to come for your Medicare Wellness Visit. I appreciate your ongoing commitment to your health goals. Please review the following plan we discussed and let me know if I can assist you in the future.   These are the goals we discussed:  Goals       Patient Stated     Maintain Healthy Lifestyle (pt-stated)      Stay active walking 10,000 steps daily Healthy diet Add yoga Light strength training        This is a list of the screening recommended for you and due dates:  Health Maintenance  Topic Date Due   DTaP/Tdap/Td vaccine (1 - Tdap) Never done   COVID-19 Vaccine (4 - 2023-24 season) 08/20/2022*   Pneumonia Vaccine (1 of 1 - PCV) 09/27/2022*   Hepatitis C Screening: USPSTF Recommendation to screen - Ages 18-79 yo.  10/02/2022*   Flu Shot  11/27/2022   Medicare Annual Wellness Visit  08/04/2023   Mammogram  04/30/2024   Colon Cancer Screening  06/25/2027   DEXA scan (bone density measurement)  Completed   Zoster (Shingles) Vaccine  Completed   HPV Vaccine  Aged Out  *Topic was postponed. The date shown is not the original due date.    Advanced directives: End of life planning; Advance aging; Advanced directives discussed.  Copy of current HCPOA/Living Will requested.    Conditions/risks identified: none new  Next appointment: Follow up in one year for your annual wellness visit    Preventive Care 65 Years and Older, Female Preventive care refers to lifestyle choices and visits with your health care provider that can promote health and wellness. What does preventive care include? A yearly physical exam. This is also called an annual well check. Dental exams once or twice a year. Routine eye exams. Ask your health care provider how often you should have your eyes checked. Personal lifestyle choices, including: Daily care of your teeth and gums. Regular physical activity. Eating a healthy diet. Avoiding tobacco and drug  use. Limiting alcohol use. Practicing safe sex. Taking low-dose aspirin every day. Taking vitamin and mineral supplements as recommended by your health care provider. What happens during an annual well check? The services and screenings done by your health care provider during your annual well check will depend on your age, overall health, lifestyle risk factors, and family history of disease. Counseling  Your health care provider may ask you questions about your: Alcohol use. Tobacco use. Drug use. Emotional well-being. Home and relationship well-being. Sexual activity. Eating habits. History of falls. Memory and ability to understand (cognition). Work and work Astronomer. Reproductive health. Screening  You may have the following tests or measurements: Height, weight, and BMI. Blood pressure. Lipid and cholesterol levels. These may be checked every 5 years, or more frequently if you are over 22 years old. Skin check. Lung cancer screening. You may have this screening every year starting at age 40 if you have a 30-pack-year history of smoking and currently smoke or have quit within the past 15 years. Fecal occult blood test (FOBT) of the stool. You may have this test every year starting at age 58. Flexible sigmoidoscopy or colonoscopy. You may have a sigmoidoscopy every 5 years or a colonoscopy every 10 years starting at age 16. Hepatitis C blood test. Hepatitis B blood test. Sexually transmitted disease (STD) testing. Diabetes screening. This is done by checking your blood sugar (glucose) after you have not eaten for a  while (fasting). You may have this done every 1-3 years. Bone density scan. This is done to screen for osteoporosis. You may have this done starting at age 36. Mammogram. This may be done every 1-2 years. Talk to your health care provider about how often you should have regular mammograms. Talk with your health care provider about your test results, treatment  options, and if necessary, the need for more tests. Vaccines  Your health care provider may recommend certain vaccines, such as: Influenza vaccine. This is recommended every year. Tetanus, diphtheria, and acellular pertussis (Tdap, Td) vaccine. You may need a Td booster every 10 years. Zoster vaccine. You may need this after age 74. Pneumococcal 13-valent conjugate (PCV13) vaccine. One dose is recommended after age 47. Pneumococcal polysaccharide (PPSV23) vaccine. One dose is recommended after age 53. Talk to your health care provider about which screenings and vaccines you need and how often you need them. This information is not intended to replace advice given to you by your health care provider. Make sure you discuss any questions you have with your health care provider. Document Released: 05/11/2015 Document Revised: 01/02/2016 Document Reviewed: 02/13/2015 Elsevier Interactive Patient Education  2017 [place] Prevention in the Home Falls can cause injuries. They can happen to people of all ages. There are many things you can do to make your home safe and to help prevent falls. What can I do on the outside of my home? Regularly fix the edges of walkways and driveways and fix any cracks. Remove anything that might make you trip as you walk through a door, such as a raised step or threshold. Trim any bushes or trees on the path to your home. Use bright outdoor lighting. Clear any walking paths of anything that might make someone trip, such as rocks or tools. Regularly check to see if handrails are loose or broken. Make sure that both sides of any steps have handrails. Any raised decks and porches should have guardrails on the edges. Have any leaves, snow, or ice cleared regularly. Use sand or salt on walking paths during winter. Clean up any spills in your garage right away. This includes oil or grease spills. What can I do in the bathroom? Use night lights. Install grab  bars by the toilet and in the tub and shower. Do not use towel bars as grab bars. Use non-skid mats or decals in the tub or shower. If you need to sit down in the shower, use a plastic, non-slip stool. Keep the floor dry. Clean up any water that spills on the floor as soon as it happens. Remove soap buildup in the tub or shower regularly. Attach bath mats securely with double-sided non-slip rug tape. Do not have throw rugs and other things on the floor that can make you trip. What can I do in the bedroom? Use night lights. Make sure that you have a light by your bed that is easy to reach. Do not use any sheets or blankets that are too big for your bed. They should not hang down onto the floor. Have a firm chair that has side arms. You can use this for support while you get dressed. Do not have throw rugs and other things on the floor that can make you trip. What can I do in the kitchen? Clean up any spills right away. Avoid walking on wet floors. Keep items that you use a lot in easy-to-reach places. If you need to reach something above you,  use a strong step stool that has a grab bar. Keep electrical cords out of the way. Do not use floor polish or wax that makes floors slippery. If you must use wax, use non-skid floor wax. Do not have throw rugs and other things on the floor that can make you trip. What can I do with my stairs? Do not leave any items on the stairs. Make sure that there are handrails on both sides of the stairs and use them. Fix handrails that are broken or loose. Make sure that handrails are as long as the stairways. Check any carpeting to make sure that it is firmly attached to the stairs. Fix any carpet that is loose or worn. Avoid having throw rugs at the top or bottom of the stairs. If you do have throw rugs, attach them to the floor with carpet tape. Make sure that you have a light switch at the top of the stairs and the bottom of the stairs. If you do not have them,  ask someone to add them for you. What else can I do to help prevent falls? Wear shoes that: Do not have high heels. Have rubber bottoms. Are comfortable and fit you well. Are closed at the toe. Do not wear sandals. If you use a stepladder: Make sure that it is fully opened. Do not climb a closed stepladder. Make sure that both sides of the stepladder are locked into place. Ask someone to hold it for you, if possible. Clearly mark and make sure that you can see: Any grab bars or handrails. First and last steps. Where the edge of each step is. Use tools that help you move around (mobility aids) if they are needed. These include: Canes. Walkers. Scooters. Crutches. Turn on the lights when you go into a dark area. Replace any light bulbs as soon as they burn out. Set up your furniture so you have a clear path. Avoid moving your furniture around. If any of your floors are uneven, fix them. If there are any pets around you, be aware of where they are. Review your medicines with your doctor. Some medicines can make you feel dizzy. This can increase your chance of falling. Ask your doctor what other things that you can do to help prevent falls. This information is not intended to replace advice given to you by your health care provider. Make sure you discuss any questions you have with your health care provider. Document Released: 02/08/2009 Document Revised: 09/20/2015 Document Reviewed: 05/19/2014 Elsevier Interactive Patient Education  2017 [employer].

## 2022-08-04 NOTE — Progress Notes (Signed)
Subjective:   Lisa Chen is a 67 y.o. female who presents for Medicare Annual (Subsequent) preventive examination.  Review of Systems    No ROS.  Medicare Wellness Virtual Visit.  Visual/audio telehealth visit, UTA vital signs.   See social history for additional risk factors.   Cardiac Risk Factors include: advanced age (>65men, >35 women)     Objective:    Today's Vitals   08/04/22 1405  Weight: 117 lb (53.1 kg)  Height: 5\' 3"  (1.6 m)   Body mass index is 20.73 kg/m.     08/04/2022    2:11 PM 07/18/2021    3:42 PM 06/24/2017    7:52 AM 04/03/2015   10:20 AM  Advanced Directives  Does Patient Have a Medical Advance Directive? Yes Yes Yes Yes  Type of Estate agent of [place];Living will Healthcare Power of [place];Living will Healthcare Power of [place];Living will Healthcare Power of [place];Living will  Does patient want to make changes to medical advance directive? No - Patient declined No - Patient declined  No - Patient declined  Copy of Healthcare Power of Attorney in Chart? No - copy requested No - copy requested Yes No - copy requested    Current Medications (verified) Outpatient Encounter Medications as of 08/04/2022  Medication Sig   estradiol (ESTRACE VAGINAL) 0.1 MG/GM vaginal cream Use one applicator q day x 5 days and then continue two times per week.   No facility-administered encounter medications on file as of 08/04/2022.    Allergies (verified) Erythromycin and Other   History: Past Medical History:  Diagnosis Date   Cancer    melanoma   Headache    3/WEEK, / DUE TO EYELIDS   Osteopenia    NOT PROGRESSED IN LAST 10 YEARS   Ptosis, both eyelids    UPPER   Past Surgical History:  Procedure Laterality Date   ABDOMINAL HYSTERECTOMY  1995   AUGMENTATION MAMMAPLASTY Bilateral 2000   SALINE   BREAST BIOPSY Left 05/13/2022   US biopsy/ribbon clip/ path pending   BREAST BIOPSY Left 05/13/2022   [place] LT BREAST BX W LOC  DEV 1ST LESION IMG BX SPEC US GUIDE 05/13/2022 [facility]   BREAST EXCISIONAL BIOPSY Right 1990's   NEG   BREAST SURGERY Right    BIOPSY   BROW LIFT Bilateral 04/03/2015   Procedure: BLEPHAROPLASTY;  Surgeon: Imagene Riches, MD;  Location: [facility];  Service: Ophthalmology;  Laterality: Bilateral;   CERVICAL DISC SURGERY  2001   COLONOSCOPY WITH PROPOFOL N/A 06/24/2017   Procedure: COLONOSCOPY WITH PROPOFOL;  Surgeon: Earline Mayotte, MD;  Location: [facility];  Service: Endoscopy;  Laterality: N/A;   COSMETIC SURGERY     SKIN CANCER-MELANOMA ON BACK REMOVED-BASAL CELL ON CHEST ALSO   EYE SURGERY Bilateral 2000 AND 2002   CATARACTS   FOOT SURGERY     HERNIA REPAIR  1984   PTOSIS REPAIR Bilateral 04/03/2015   Procedure: PTOSIS REPAIR;  Surgeon: Imagene Riches, MD;  Location: [facility];  Service: Ophthalmology;  Laterality: Bilateral;   TONSILLECTOMY     Family History  Problem Relation Age of Onset   Arthritis Mother    Heart disease Mother    Diabetes Mother    Heart attack Father    Heart disease Maternal Grandmother    Heart disease Maternal Grandfather    Diabetes Maternal Grandfather    Colon cancer Cousin    Colon cancer Maternal Uncle    Heart attack  Maternal Uncle    Stroke Maternal Uncle    Breast cancer Neg Hx    Social History   Socioeconomic History   Marital status: Married    Spouse name: Not on file   Number of children: Not on file   Years of education: Not on file   Highest education level: Not on file  Occupational History   Not on file  Tobacco Use   Smoking status: Never   Smokeless tobacco: Never  Vaping Use   Vaping Use: Never used  Substance and Sexual Activity   Alcohol use: Yes    Alcohol/week: 3.0 standard drinks of alcohol    Types: 3 Glasses of wine per week    Comment: OCCAS. none last 24hrs   Drug use: No   Sexual activity: Not on file  Other Topics Concern   Not on file  Social History Narrative    Not on file   Social Determinants of Health   Financial Resource Strain: Low Risk  (08/04/2022)   Overall Financial Resource Strain (CARDIA)    Difficulty of Paying Living Expenses: Not hard at all  Food Insecurity: No Food Insecurity (08/04/2022)   Hunger Vital Sign    Worried About Running Out of Food in the Last Year: Never true    Ran Out of Food in the Last Year: Never true  Transportation Needs: No Transportation Needs (08/04/2022)   PRAPARE - Administrator, Civil Service (Medical): No    Lack of Transportation (Non-Medical): No  Physical Activity: Sufficiently Active (08/04/2022)   Exercise Vital Sign    Days of Exercise per Week: 5 days    Minutes of Exercise per Session: 30 min  Stress: No Stress Concern Present (08/04/2022)   [employer] of Occupational Health - Occupational Stress Questionnaire    Feeling of Stress : Not at all  Social Connections: Socially Integrated (08/04/2022)   Social Connection and Isolation Panel [NHANES]    Frequency of Communication with Friends and Family: More than three times a week    Frequency of Social Gatherings with Friends and Family: More than three times a week    Attends Religious Services: More than 4 times per year    Active Member of [employer] or Organizations: Yes    Attends Engineer, structural: Not on file    Marital Status: Married    Tobacco Counseling Counseling given: Not Answered   Clinical Intake:  Pre-visit preparation completed: Yes        Diabetes: No  How often do you need to have someone help you when you read instructions, pamphlets, or other written materials from your doctor or pharmacy?: 1 - Never    Interpreter Needed?: No      Activities of Daily Living    08/04/2022    2:12 PM  In your present state of health, do you have any difficulty performing the following activities:  Hearing? 0  Vision? 0  Difficulty concentrating or making decisions? 0  Walking or climbing stairs? 0   Dressing or bathing? 0  Doing errands, shopping? 0  Preparing Food and eating ? N  Using the Toilet? N  In the past six months, have you accidently leaked urine? N  Do you have problems with loss of bowel control? N  Managing your Medications? N  Managing your Finances? N  Housekeeping or managing your Housekeeping? N    Patient Care Team: Dale Hartford, MD as PCP - General (Internal Medicine) Dale Ghent,  MD (Internal Medicine) Lemar LivingsByrnett, Merrily PewJeffrey W, MD (General Surgery)  Indicate any recent Medical Services you may have received from other than [facility] providers in the past year (date may be approximate).     Assessment:   This is a routine wellness examination for Lisa Chen.  I connected with  Lisa Chen on 08/04/22 by a audio enabled telemedicine application and verified that I am speaking with the correct person using two identifiers.  Patient Location: Home  Provider Location: Office/Clinic  I discussed the limitations of evaluation and management by telemedicine. The patient expressed understanding and agreed to proceed.   Hearing/Vision screen Hearing Screening - Comments:: Patient is able to hear conversational tones without difficulty. No issues reported. Vision Screening - Comments:: Cataract extraction, bilateral Wears readers occasional They have seen their ophthalmologist in the last 12 months.    Dietary issues and exercise activities discussed: Current Exercise Habits: Home exercise routine, Type of exercise: walking, Time (Minutes): 30, Frequency (Times/Week): 5, Weekly Exercise (Minutes/Week): 150, Intensity: Mild   Goals Addressed               This Visit's Progress     Patient Stated     Maintain Healthy Lifestyle (pt-stated)   On track     Stay active walking 10,000 steps daily Healthy diet Add yoga Light strength training       Depression Screen    08/04/2022    2:08 PM 10/01/2021    2:59 PM 07/18/2021    3:44 PM 03/11/2021     9:07 AM 07/03/2020    1:49 PM 03/07/2020    8:35 AM 01/10/2019    4:42 PM  PHQ 2/9 Scores  PHQ - 2 Score 0 0 0 0 0 0 0    Fall Risk    08/04/2022    2:12 PM 10/01/2021    2:59 PM 07/18/2021    3:44 PM 07/03/2020    1:52 PM 03/07/2020    8:34 AM  Fall Risk   Falls in the past year? 0 0 0 0 0  Number falls in past yr: 0  0 0   Injury with Fall? 0   0   Risk for fall due to :  No Fall Risks     Follow up Falls evaluation completed;Falls prevention discussed Falls evaluation completed Falls evaluation completed Falls evaluation completed Falls evaluation completed    FALL RISK PREVENTION PERTAINING TO THE HOME: Home free of loose throw rugs in walkways, pet beds, electrical cords, etc? Yes  Adequate lighting in your home to reduce risk of falls? Yes   ASSISTIVE DEVICES UTILIZED TO PREVENT FALLS: Life alert? No  Use of a cane, walker or w/c? No  Grab bars in the bathroom? No  Shower chair or bench in shower? No  Elevated toilet seat or a handicapped toilet? No   TIMED UP AND GO: Was the test performed? No .    Cognitive Function:        08/04/2022    2:13 PM  6CIT Screen  What Year? 0 points  What month? 0 points  What time? 0 points  Count back from 20 0 points  Months in reverse 0 points  Repeat phrase 0 points  Total Score 0 points    Immunizations Immunization History  Administered Date(s) Administered   Influenza, High Dose Seasonal PF 02/18/2021   Influenza,inj,Quad PF,6+ Mos 12/26/2016, 12/30/2017, 01/10/2019, 03/07/2020   PFIZER Comirnaty(Gray Top)Covid-19 Tri-Sucrose Vaccine 06/03/2019, 06/24/2019, 04/23/2020   Zoster Recombinat (Shingrix)  11/20/2021, 02/27/2022   TDAP status: Due, Education has been provided regarding the importance of this vaccine. Advised may receive this vaccine at local pharmacy or Health Dept. Aware to provide a copy of the vaccination record if obtained from local pharmacy or Health Dept. Verbalized acceptance and  understanding.  Pneumococcal vaccine status: Due, Education has been provided regarding the importance of this vaccine. Advised may receive this vaccine at local pharmacy or Health Dept. Aware to provide a copy of the vaccination record if obtained from local pharmacy or Health Dept. Verbalized acceptance and understanding.  Covid-19 vaccine status: Completed vaccines x3.  Screening Tests Health Maintenance  Topic Date Due   DTaP/Tdap/Td (1 - Tdap) Never done   COVID-19 Vaccine (4 - 2023-24 season) 08/20/2022 (Originally 12/27/2021)   Pneumonia Vaccine 61+ Years old (1 of 1 - PCV) 09/27/2022 (Originally 05/30/2019)   Hepatitis C Screening  10/02/2022 (Originally 05/29/1973)   INFLUENZA VACCINE  11/27/2022   Medicare Annual Wellness (AWV)  08/04/2023   MAMMOGRAM  04/30/2024   COLONOSCOPY (Pts 45-48yrs Insurance coverage will need to be confirmed)  06/25/2027   DEXA SCAN  Completed   Zoster Vaccines- Shingrix  Completed   HPV VACCINES  Aged Out    Health Maintenance Health Maintenance Due  Topic Date Due   DTaP/Tdap/Td (1 - Tdap) Never done   Lung Cancer Screening: (Low Dose CT Chest recommended if Age 87-80 years, 30 pack-year currently smoking OR have quit w/in 15years.) does not qualify.   Hepatitis C Screening: deferred due to audio visit   Vision Screening: Recommended annual ophthalmology exams for early detection of glaucoma and other disorders of the eye.  Dental Screening: Recommended annual dental exams for proper oral hygiene  Community Resource Referral / Chronic Care Management: CRR required this visit?  No   CCM required this visit?  No      Plan:     I have personally reviewed and noted the following in the patient's chart:   Medical and social history Use of alcohol, tobacco or illicit drugs  Current medications and supplements including opioid prescriptions. Patient is not currently taking opioid prescriptions. Functional ability and status Nutritional  status Physical activity Advanced directives List of other physicians Hospitalizations, surgeries, and ER visits in previous 12 months Vitals Screenings to include cognitive, depression, and falls Referrals and appointments  In addition, I have reviewed and discussed with patient certain preventive protocols, quality metrics, and best practice recommendations. A written personalized care plan for preventive services as well as general preventive health recommendations were provided to patient.     Cathey Endow, LPN   11/03/2954

## 2022-09-11 ENCOUNTER — Other Ambulatory Visit: Payer: Medicare HMO

## 2022-09-15 ENCOUNTER — Ambulatory Visit: Payer: Medicare HMO | Admitting: Internal Medicine

## 2022-09-16 ENCOUNTER — Telehealth: Payer: Self-pay | Admitting: *Deleted

## 2022-09-16 DIAGNOSIS — E7800 Pure hypercholesterolemia, unspecified: Secondary | ICD-10-CM

## 2022-09-16 NOTE — Telephone Encounter (Signed)
Labs ordered.

## 2022-09-16 NOTE — Telephone Encounter (Signed)
Patient has lab appt tomorrow morning. Please place future lab orders.

## 2022-09-17 ENCOUNTER — Other Ambulatory Visit (INDEPENDENT_AMBULATORY_CARE_PROVIDER_SITE_OTHER): Payer: Medicare HMO

## 2022-09-17 DIAGNOSIS — E7800 Pure hypercholesterolemia, unspecified: Secondary | ICD-10-CM | POA: Diagnosis not present

## 2022-09-17 LAB — LIPID PANEL
Cholesterol: 246 mg/dL — ABNORMAL HIGH (ref 0–200)
HDL: 83.8 mg/dL (ref >39.00)
LDL Cholesterol: 150 mg/dL — ABNORMAL HIGH (ref 0–99)
NonHDL: 162.49
Total CHOL/HDL Ratio: 3
Triglycerides: 60 mg/dL (ref 0.0–149.0)
VLDL: 12 mg/dL (ref 0.0–40.0)

## 2022-09-17 LAB — HEPATIC FUNCTION PANEL
ALT: 17 U/L (ref 0–35)
AST: 23 U/L (ref 0–37)
Albumin: 4.2 g/dL (ref 3.5–5.2)
Alkaline Phosphatase: 59 U/L (ref 39–117)
Bilirubin, Direct: 0.1 mg/dL (ref 0.0–0.3)
Total Bilirubin: 0.8 mg/dL (ref 0.2–1.2)
Total Protein: 7 g/dL (ref 6.0–8.3)

## 2022-09-17 LAB — BASIC METABOLIC PANEL
BUN: 16 mg/dL (ref 6–23)
CO2: 30 mEq/L (ref 19–32)
Calcium: 9.4 mg/dL (ref 8.4–10.5)
Chloride: 104 mEq/L (ref 96–112)
Creatinine, Ser: 0.65 mg/dL (ref 0.40–1.20)
GFR: 90.54 mL/min (ref >60.00)
Glucose, Bld: 91 mg/dL (ref 70–99)
Potassium: 4.4 mEq/L (ref 3.5–5.1)
Sodium: 140 mEq/L (ref 135–145)

## 2022-09-23 ENCOUNTER — Encounter: Payer: Self-pay | Admitting: Internal Medicine

## 2022-09-23 ENCOUNTER — Ambulatory Visit (INDEPENDENT_AMBULATORY_CARE_PROVIDER_SITE_OTHER): Payer: Medicare HMO | Admitting: Internal Medicine

## 2022-09-23 VITALS — BP 118/70 | HR 83 | Temp 98.2°F | Resp 16 | Ht 63.0 in | Wt 117.2 lb

## 2022-09-23 DIAGNOSIS — R232 Flushing: Secondary | ICD-10-CM | POA: Diagnosis not present

## 2022-09-23 DIAGNOSIS — E7800 Pure hypercholesterolemia, unspecified: Secondary | ICD-10-CM

## 2022-09-23 DIAGNOSIS — Z800 Family history of malignant neoplasm of digestive organs: Secondary | ICD-10-CM | POA: Diagnosis not present

## 2022-09-23 DIAGNOSIS — M810 Age-related osteoporosis without current pathological fracture: Secondary | ICD-10-CM

## 2022-09-23 MED ORDER — ESTRADIOL 0.1 MG/GM VA CREA: TOPICAL_CREAM | VAGINAL | 2 refills | Status: DC

## 2022-09-23 NOTE — Progress Notes (Signed)
Subjective:    Patient ID: Lisa Chen, female    DOB: 1956/01/30, 67 y.o.   MRN: [id number]  Patient here for  Chief Complaint  Patient presents with   Medical Management of Chronic Issues    HPI Here to follow up regarding hypercholesterolemia, hot flashes and osteoporosis.  Recently s/p breast biopsy.  Biopsy ok and radiology recommended f/u mammogram 04/2023. Still with persistent hot flashes.  Is monitoring.  Will notify if feels needs further intervention.  Stays active.  Walking.  No chest pain or sob reported.  No abdominal pain or bowel change reported.    Past Medical History:  Diagnosis Date   Cancer (HCC)    melanoma   Headache    3/WEEK, / DUE TO EYELIDS   Osteopenia    NOT PROGRESSED IN LAST 10 YEARS   Ptosis, both eyelids    UPPER   Past Surgical History:  Procedure Laterality Date   ABDOMINAL HYSTERECTOMY  1995   AUGMENTATION MAMMAPLASTY Bilateral 2000   SALINE   BREAST BIOPSY Left 05/13/2022   US biopsy/ribbon clip/ path pending   BREAST BIOPSY Left 05/13/2022   [place] LT BREAST BX W LOC DEV 1ST LESION IMG BX SPEC US GUIDE 05/13/2022 [facility]   BREAST EXCISIONAL BIOPSY Right 1990's   NEG   BREAST SURGERY Right    BIOPSY   BROW LIFT Bilateral 04/03/2015   Procedure: BLEPHAROPLASTY;  Surgeon: Imagene Riches, MD;  Location: [facility];  Service: Ophthalmology;  Laterality: Bilateral;   CERVICAL DISC SURGERY  2001   COLONOSCOPY WITH PROPOFOL N/A 06/24/2017   Procedure: COLONOSCOPY WITH PROPOFOL;  Surgeon: Earline Mayotte, MD;  Location: [facility];  Service: Endoscopy;  Laterality: N/A;   COSMETIC SURGERY     SKIN CANCER-MELANOMA ON BACK REMOVED-BASAL CELL ON CHEST ALSO   EYE SURGERY Bilateral 2000 AND 2002   CATARACTS   FOOT SURGERY     HERNIA REPAIR  1984   PTOSIS REPAIR Bilateral 04/03/2015   Procedure: PTOSIS REPAIR;  Surgeon: Imagene Riches, MD;  Location: [facility];  Service: Ophthalmology;  Laterality: Bilateral;    TONSILLECTOMY     Family History  Problem Relation Age of Onset   Arthritis Mother    Heart disease Mother    Diabetes Mother    Heart attack Father    Heart disease Maternal Grandmother    Heart disease Maternal Grandfather    Diabetes Maternal Grandfather    Colon cancer Cousin    Colon cancer Maternal Uncle    Heart attack Maternal Uncle    Stroke Maternal Uncle    Breast cancer Neg Hx    Social History   Socioeconomic History   Marital status: Married    Spouse name: Not on file   Number of children: Not on file   Years of education: Not on file   Highest education level: 12th grade  Occupational History   Not on file  Tobacco Use   Smoking status: Never   Smokeless tobacco: Never  Vaping Use   Vaping Use: Never used  Substance and Sexual Activity   Alcohol use: Yes    Alcohol/week: 3.0 standard drinks of alcohol    Types: 3 Glasses of wine per week    Comment: OCCAS. none last 24hrs   Drug use: No   Sexual activity: Not on file  Other Topics Concern   Not on file  Social History Narrative   Not on file   Social Determinants  of Health   Financial Resource Strain: Low Risk  (09/22/2022)   Overall Financial Resource Strain (CARDIA)    Difficulty of Paying Living Expenses: Not hard at all  Food Insecurity: No Food Insecurity (09/22/2022)   Hunger Vital Sign    Worried About Running Out of Food in the Last Year: Never true    Ran Out of Food in the Last Year: Never true  Transportation Needs: No Transportation Needs (09/22/2022)   PRAPARE - Administrator, Civil Service (Medical): No    Lack of Transportation (Non-Medical): No  Physical Activity: Insufficiently Active (09/22/2022)   Exercise Vital Sign    Days of Exercise per Week: 4 days    Minutes of Exercise per Session: 30 min  Stress: No Stress Concern Present (09/22/2022)   [employer] of Occupational Health - Occupational Stress Questionnaire    Feeling of Stress : Only a little   Social Connections: Socially Integrated (09/22/2022)   Social Connection and Isolation Panel [NHANES]    Frequency of Communication with Friends and Family: More than three times a week    Frequency of Social Gatherings with Friends and Family: More than three times a week    Attends Religious Services: More than 4 times per year    Active Member of [employer] or Organizations: Yes    Attends Engineer, structural: More than 4 times per year    Marital Status: Married     Review of Systems  Constitutional:  Negative for appetite change and unexpected weight change.  HENT:  Negative for congestion and sinus pressure.   Respiratory:  Negative for cough, chest tightness and shortness of breath.   Cardiovascular:  Negative for chest pain, palpitations and leg swelling.  Gastrointestinal:  Negative for abdominal pain, diarrhea, nausea and vomiting.  Genitourinary:  Negative for difficulty urinating and dysuria.  Musculoskeletal:  Negative for joint swelling and myalgias.  Skin:  Negative for color change and rash.  Neurological:  Negative for dizziness and headaches.  Psychiatric/Behavioral:  Negative for agitation and dysphoric mood.        Objective:     BP 118/70   Pulse 83   Temp 98.2 F (36.8 C)   Resp 16   Ht 5\' 3"  (1.6 m)   Wt 117 lb 3.2 oz (53.2 kg)   SpO2 98%   BMI 20.76 kg/m  Wt Readings from Last 3 Encounters:  09/23/22 117 lb 3.2 oz (53.2 kg)  08/04/22 117 lb (53.1 kg)  03/14/22 117 lb 12.8 oz (53.4 kg)    Physical Exam Vitals reviewed.  Constitutional:      General: She is not in acute distress.    Appearance: Normal appearance.  HENT:     Head: Normocephalic and atraumatic.     Right Ear: External ear normal.     Left Ear: External ear normal.  Eyes:     General: No scleral icterus.       Right eye: No discharge.        Left eye: No discharge.     Conjunctiva/sclera: Conjunctivae normal.  Neck:     Thyroid: No thyromegaly.  Cardiovascular:      Rate and Rhythm: Normal rate and regular rhythm.  Pulmonary:     Effort: No respiratory distress.     Breath sounds: Normal breath sounds. No wheezing.  Abdominal:     General: Bowel sounds are normal.     Palpations: Abdomen is soft.     Tenderness: There  is no abdominal tenderness.  Musculoskeletal:        General: No swelling or tenderness.     Cervical back: Neck supple. No tenderness.  Lymphadenopathy:     Cervical: No cervical adenopathy.  Skin:    Findings: No erythema or rash.  Neurological:     Mental Status: She is alert.  Psychiatric:        Mood and Affect: Mood normal.        Behavior: Behavior normal.      Outpatient Encounter Medications as of 09/23/2022  Medication Sig   estradiol (ESTRACE VAGINAL) 0.1 MG/GM vaginal cream Use one applicator q day x 5 days and then continue two times per week.   [DISCONTINUED] estradiol (ESTRACE VAGINAL) 0.1 MG/GM vaginal cream Use one applicator q day x 5 days and then continue two times per week.   No facility-administered encounter medications on file as of 09/23/2022.     Lab Results  Component Value Date   WBC 5.0 03/11/2022   HGB 12.7 03/11/2022   HCT 37.5 03/11/2022   PLT 304.0 03/11/2022   GLUCOSE 91 09/17/2022   CHOL 246 (H) 09/17/2022   TRIG 60.0 09/17/2022   HDL 83.80 09/17/2022   LDLCALC 150 (H) 09/17/2022   ALT 17 09/17/2022   AST 23 09/17/2022   NA 140 09/17/2022   K 4.4 09/17/2022   CL 104 09/17/2022   CREATININE 0.65 09/17/2022   BUN 16 09/17/2022   CO2 30 09/17/2022   TSH 2.36 03/11/2022    [place] LT BREAST BX W LOC DEV 1ST LESION IMG BX SPEC US GUIDE  Addendum Date: 05/15/2022   ADDENDUM REPORT: 05/15/2022 17:14 ADDENDUM: PATHOLOGY revealed: A. BREAST, LEFT AT 3:00, RETROAREOLAR; ULTRASOUND-GUIDED CORE NEEDLE BIOPSY: - BENIGN MAMMARY PARENCHYMA DISPLAYING CYSTICALLY DILATED DUCT SPACE WITH ABUNDANT FOAMY MACROPHAGES, WITH ASSOCIATED DENSE FIBROSIS AND CHRONIC INFLAMMATION, MOST SUGGESTIVE OF DUCT  ECTASIA. - NEGATIVE FOR ATYPICAL PROLIFERATIVE BREAST DISEASE. Pathology results are CONCORDANT with imaging findings, per Dr. Gerome Sam. Pathology results and recommendations were discussed with patient via telephone on 05/14/2022. Patient reported biopsy site doing well with no adverse symptoms, and only slight tenderness at the site. Post biopsy care instructions were reviewed, questions were answered and my direct phone number was provided. Patient was instructed to call [facility] for any additional questions or concerns related to biopsy site. RECOMMENDATION: Patient instructed to continue monthly self breast examinations and resume annual bilateral screening mammogram due January 2025. Pathology results reported by Randa Lynn RN on 05/15/2022. Electronically Signed   By: Gerome Sam III M.D.   On: 05/15/2022 17:14   Result Date: 05/15/2022 CLINICAL DATA:  Biopsy of a 3 o'clock retroareolar left breast mass. EXAM: ULTRASOUND GUIDED LEFT BREAST CORE NEEDLE BIOPSY COMPARISON:  Previous exam(s). PROCEDURE: I met with the patient and we discussed the procedure of ultrasound-guided biopsy, including benefits and alternatives. We discussed the high likelihood of a successful procedure. We discussed the risks of the procedure, including infection, bleeding, tissue injury, clip migration, and inadequate sampling. Informed written consent was given. The usual time-out protocol was performed immediately prior to the procedure. Lesion quadrant: 3 o'clock left breast Using sterile technique and 1% Lidocaine as local anesthetic, under direct ultrasound visualization, a 14 gauge spring-loaded device was used to perform biopsy of a 3 o'clock left breast mass using a lateral approach. At the conclusion of the procedure a ribbon shaped tissue marker clip was deployed into the biopsy cavity. Follow up 2 view mammogram  was performed and dictated separately. IMPRESSION: Ultrasound guided biopsy of a 3 o'clock  left breast mass. No apparent complications. Electronically Signed: By: Gerome Sam III M.D. On: 05/13/2022 09:35  MM CLIP PLACEMENT LEFT  Result Date: 05/13/2022 CLINICAL DATA:  Evaluate biopsy marker EXAM: 3D DIAGNOSTIC LEFT MAMMOGRAM POST ULTRASOUND BIOPSY COMPARISON:  Previous exam(s). FINDINGS: 3D Mammographic images were obtained following ultrasound guided biopsy of a left breast mass. The biopsy marking clip is in expected position at the site of biopsy. IMPRESSION: Appropriate positioning of the ribbon shaped biopsy marking clip at the site of biopsy in the biopsied left breast mass. Final Assessment: Post Procedure Mammograms for Marker Placement Electronically Signed   By: Gerome Sam III M.D.   On: 05/13/2022 09:38      Assessment & Plan:  Hypercholesterolemia Assessment & Plan: The 10-year ASCVD risk score (Arnett DK, et al., 2019) is: 6.3%   Values used to calculate the score:     Age: 22 years     Sex: Female     Is Non-Hispanic African American: No     Diabetic: No     Tobacco smoker: No     Systolic Blood Pressure: 118 mmHg     Is BP treated: No     HDL Cholesterol: 83.8 mg/dL     Total Cholesterol: 246 mg/dL  Low cholesterol diet and exercise.  Follow lipid panel.   Orders: -     CBC with Differential/Platelet; Future -     Lipid panel; Future -     Hepatic function panel; Future -     Basic metabolic panel; Future -     TSH; Future  Family history of colon cancer Assessment & Plan: Colonoscopy 05/2017 - diverticulosis.  Recommended f/u in 10 years. States - maternal uncle with colon cancer.     Hot flashes Assessment & Plan: Off oral estrogen/patch.  Discussed with her today.  Discussed treatment options.  Have discussed being placed back on estrogen and have discussed a trial of effexor.  Will notify me if desires to start medication.    Osteoporosis without current pathological fracture, unspecified osteoporosis type Assessment & Plan: Off  estrogen.  Have discussed weight bearing exercise, calcium and vitamin D. Have discussed reclast.    Other orders -     Estradiol; Use one applicator q day x 5 days and then continue two times per week.  Dispense: 42.5 g; Refill: 2     Dale Ardmore, MD

## 2022-09-23 NOTE — Assessment & Plan Note (Signed)
The 10-year ASCVD risk score (Arnett DK, et al., 2019) is: 6.3%   Values used to calculate the score:     Age: 67 years     Sex: Female     Is Non-Hispanic African American: No     Diabetic: No     Tobacco smoker: No     Systolic Blood Pressure: 118 mmHg     Is BP treated: No     HDL Cholesterol: 83.8 mg/dL     Total Cholesterol: 246 mg/dL  Low cholesterol diet and exercise.  Follow lipid panel.

## 2022-09-28 ENCOUNTER — Encounter: Payer: Self-pay | Admitting: Internal Medicine

## 2022-09-28 ENCOUNTER — Telehealth: Payer: Self-pay | Admitting: Internal Medicine

## 2022-09-28 DIAGNOSIS — M810 Age-related osteoporosis without current pathological fracture: Secondary | ICD-10-CM

## 2022-09-28 NOTE — Assessment & Plan Note (Signed)
Off oral estrogen/patch.  Discussed with her today.  Discussed treatment options.  Have discussed being placed back on estrogen and have discussed a trial of effexor.  Will notify me if desires to start medication.

## 2022-09-28 NOTE — Assessment & Plan Note (Signed)
Colonoscopy 05/2017 - diverticulosis.  Recommended f/u in 10 years. States - maternal uncle with colon cancer.   

## 2022-09-28 NOTE — Telephone Encounter (Signed)
She had previously discussed waiting until after first of the year for reclast infusion.  She mentioned at her appt about possibly going ahead with reclast.  Please confirm with her if she desires for me to place referral now to endocrinology for evaluation for osteoporosis treatment.  Wil be through [facility] endocrinology.

## 2022-09-28 NOTE — Assessment & Plan Note (Signed)
Off estrogen.  Have discussed weight bearing exercise, calcium and vitamin D. Have discussed reclast.

## 2022-09-30 NOTE — Addendum Note (Signed)
Addended by: Rita Ohara D on: 09/30/2022 08:23 AM   Modules accepted: Orders

## 2022-09-30 NOTE — Telephone Encounter (Signed)
Referral placed for endocrinology. Patient would like to schedule some time after July.

## 2022-10-01 ENCOUNTER — Telehealth: Payer: Self-pay | Admitting: Internal Medicine

## 2022-10-01 NOTE — Telephone Encounter (Signed)
Victorino Dike from [facility] called in stating that they need a copy(fax) of pt last bone density.? It can be fax [phone].

## 2022-10-02 NOTE — Telephone Encounter (Signed)
Faxed to [facility]

## 2022-11-05 ENCOUNTER — Encounter: Payer: Self-pay | Admitting: Endocrinology

## 2022-11-05 DIAGNOSIS — M810 Age-related osteoporosis without current pathological fracture: Secondary | ICD-10-CM | POA: Diagnosis not present

## 2022-11-05 DIAGNOSIS — Z780 Asymptomatic menopausal state: Secondary | ICD-10-CM | POA: Diagnosis not present

## 2022-11-06 ENCOUNTER — Other Ambulatory Visit: Payer: Self-pay | Admitting: Endocrinology

## 2022-11-06 DIAGNOSIS — M810 Age-related osteoporosis without current pathological fracture: Secondary | ICD-10-CM

## 2023-03-25 ENCOUNTER — Other Ambulatory Visit (INDEPENDENT_AMBULATORY_CARE_PROVIDER_SITE_OTHER): Payer: Medicare HMO

## 2023-03-25 DIAGNOSIS — E7800 Pure hypercholesterolemia, unspecified: Secondary | ICD-10-CM

## 2023-03-25 LAB — CBC WITH DIFFERENTIAL/PLATELET
Basophils Absolute: 0 10*3/uL (ref 0.0–0.1)
Basophils Relative: 1 % (ref 0.0–3.0)
Eosinophils Absolute: 0.1 10*3/uL (ref 0.0–0.7)
Eosinophils Relative: 1.8 % (ref 0.0–5.0)
HCT: 38.8 % (ref 36.0–46.0)
Hemoglobin: 12.8 g/dL (ref 12.0–15.0)
Lymphocytes Relative: 31.3 % (ref 12.0–46.0)
Lymphs Abs: 1.5 10*3/uL (ref 0.7–4.0)
MCHC: 33 g/dL (ref 30.0–36.0)
MCV: 95.7 fL (ref 78.0–100.0)
Monocytes Absolute: 0.6 10*3/uL (ref 0.1–1.0)
Monocytes Relative: 11.4 % (ref 3.0–12.0)
Neutro Abs: 2.7 10*3/uL (ref 1.4–7.7)
Neutrophils Relative %: 54.5 % (ref 43.0–77.0)
Platelets: 299 10*3/uL (ref 150.0–400.0)
RBC: 4.06 Mil/uL (ref 3.87–5.11)
RDW: 12.5 % (ref 11.5–15.5)
WBC: 4.9 10*3/uL (ref 4.0–10.5)

## 2023-03-25 LAB — BASIC METABOLIC PANEL
BUN: 18 mg/dL (ref 6–23)
CO2: 28 meq/L (ref 19–32)
Calcium: 9.3 mg/dL (ref 8.4–10.5)
Chloride: 106 meq/L (ref 96–112)
Creatinine, Ser: 0.64 mg/dL (ref 0.40–1.20)
GFR: 90.55 mL/min (ref >60.00)
Glucose, Bld: 89 mg/dL (ref 70–99)
Potassium: 4 meq/L (ref 3.5–5.1)
Sodium: 140 meq/L (ref 135–145)

## 2023-03-25 LAB — TSH: TSH: 2.13 u[IU]/mL (ref 0.35–5.50)

## 2023-03-25 LAB — HEPATIC FUNCTION PANEL
ALT: 21 U/L (ref 0–35)
AST: 23 U/L (ref 0–37)
Albumin: 4.5 g/dL (ref 3.5–5.2)
Alkaline Phosphatase: 59 U/L (ref 39–117)
Bilirubin, Direct: 0.1 mg/dL (ref 0.0–0.3)
Total Bilirubin: 0.9 mg/dL (ref 0.2–1.2)
Total Protein: 7.2 g/dL (ref 6.0–8.3)

## 2023-03-25 LAB — LIPID PANEL
Cholesterol: 253 mg/dL — ABNORMAL HIGH (ref 0–200)
HDL: 75.5 mg/dL (ref >39.00)
LDL Cholesterol: 164 mg/dL — ABNORMAL HIGH (ref 0–99)
NonHDL: 177.35
Total CHOL/HDL Ratio: 3
Triglycerides: 68 mg/dL (ref 0.0–149.0)
VLDL: 13.6 mg/dL (ref 0.0–40.0)

## 2023-03-31 ENCOUNTER — Ambulatory Visit (INDEPENDENT_AMBULATORY_CARE_PROVIDER_SITE_OTHER): Payer: Medicare HMO | Admitting: Internal Medicine

## 2023-03-31 VITALS — BP 128/70 | HR 70 | Temp 98.2°F | Resp 16 | Ht 63.0 in | Wt 121.4 lb

## 2023-03-31 DIAGNOSIS — Z0000 Encounter for general adult medical examination without abnormal findings: Secondary | ICD-10-CM

## 2023-03-31 DIAGNOSIS — Z1231 Encounter for screening mammogram for malignant neoplasm of breast: Secondary | ICD-10-CM

## 2023-03-31 DIAGNOSIS — M810 Age-related osteoporosis without current pathological fracture: Secondary | ICD-10-CM | POA: Diagnosis not present

## 2023-03-31 DIAGNOSIS — E7800 Pure hypercholesterolemia, unspecified: Secondary | ICD-10-CM | POA: Diagnosis not present

## 2023-03-31 MED ORDER — ESTRADIOL 0.1 MG/GM VA CREA: TOPICAL_CREAM | VAGINAL | 2 refills | Status: DC

## 2023-03-31 NOTE — Assessment & Plan Note (Signed)
The 10-year ASCVD risk score (Arnett DK, et al., 2019) is: 6.4%   Values used to calculate the score:     Age: 66 years     Sex: Female     Is Non-Hispanic African American: No     Diabetic: No     Tobacco smoker: No     Systolic Blood Pressure: 116 mmHg     Is BP treated: No     HDL Cholesterol: 75.5 mg/dL     Total Cholesterol: 253 mg/dL  Low cholesterol diet and exercise.  Follow lipid panel.

## 2023-03-31 NOTE — Progress Notes (Signed)
Subjective:    Patient ID: Lisa Chen, female    DOB: [birth year], 67 y.o.   MRN: [id number]  Patient here for  Chief Complaint  Patient presents with   Medical Management of Chronic Issues    HPI Here for a physical exam.  Saw endocrinology 10/2022 - discussed osteoporosis treatment. Recommended f/u bone density and then discuss treatment. Bone density scheduled for 04/20/23. Also, s/p breast biopsy. Biopsy ok and radiology recommended f/u screening mammogram 04/2023. Previous CTA (2021) - calcium score 0.  No evidence of CAD. Stays active.  No chest pain or sob with increased activity or exertion.  Discomfort - right buttock. Notify if continues/desires further w/up.   Past Medical History:  Diagnosis Date   Cancer (HCC)    melanoma   Headache    3/WEEK, / DUE TO EYELIDS   Osteopenia    NOT PROGRESSED IN LAST 10 YEARS   Ptosis, both eyelids    UPPER   Past Surgical History:  Procedure Laterality Date   ABDOMINAL HYSTERECTOMY  1995   AUGMENTATION MAMMAPLASTY Bilateral 2000   SALINE   BREAST BIOPSY Left 05/13/2022   US biopsy/ribbon clip/ path pending   BREAST BIOPSY Left 05/13/2022   [place] LT BREAST BX W LOC DEV 1ST LESION IMG BX SPEC US GUIDE 05/13/2022 [facility]   BREAST EXCISIONAL BIOPSY Right 1990's   NEG   BREAST SURGERY Right    BIOPSY   BROW LIFT Bilateral 04/03/2015   Procedure: BLEPHAROPLASTY;  Surgeon: Imagene Riches, MD;  Location: [facility];  Service: Ophthalmology;  Laterality: Bilateral;   CERVICAL DISC SURGERY  2001   COLONOSCOPY WITH PROPOFOL N/A 06/24/2017   Procedure: COLONOSCOPY WITH PROPOFOL;  Surgeon: Earline Mayotte, MD;  Location: [facility];  Service: Endoscopy;  Laterality: N/A;   COSMETIC SURGERY     SKIN CANCER-MELANOMA ON BACK REMOVED-BASAL CELL ON CHEST ALSO   EYE SURGERY Bilateral 2000 AND 2002   CATARACTS   FOOT SURGERY     HERNIA REPAIR  1984   PTOSIS REPAIR Bilateral 04/03/2015   Procedure: PTOSIS REPAIR;   Surgeon: Imagene Riches, MD;  Location: [facility];  Service: Ophthalmology;  Laterality: Bilateral;   TONSILLECTOMY     Family History  Problem Relation Age of Onset   Arthritis Mother    Heart disease Mother    Diabetes Mother    Heart attack Father    Heart disease Maternal Grandmother    Heart disease Maternal Grandfather    Diabetes Maternal Grandfather    Colon cancer Cousin    Colon cancer Maternal Uncle    Heart attack Maternal Uncle    Stroke Maternal Uncle    Breast cancer Neg Hx    Social History   Socioeconomic History   Marital status: Married    Spouse name: Not on file   Number of children: Not on file   Years of education: Not on file   Highest education level: 12th grade  Occupational History   Not on file  Tobacco Use   Smoking status: Never   Smokeless tobacco: Never  Vaping Use   Vaping status: Never Used  Substance and Sexual Activity   Alcohol use: Yes    Alcohol/week: 3.0 standard drinks of alcohol    Types: 3 Glasses of wine per week    Comment: OCCAS. none last 24hrs   Drug use: No   Sexual activity: Not on file  Other Topics Concern   Not on file  Social History Narrative   Not on file   Social Determinants of Health   Financial Resource Strain: Low Risk  (10/31/2022)   Received from [facility], [facility]   Overall Financial Resource Strain (CARDIA)    Difficulty of Paying Living Expenses: Not hard at all  Food Insecurity: No Food Insecurity (10/31/2022)   Received from [facility], [facility]   Hunger Vital Sign    Worried About Running Out of Food in the Last Year: Never true    Ran Out of Food in the Last Year: Never true  Transportation Needs: No Transportation Needs (10/31/2022)   Received from [facility], [facility]   [facility] - Transportation    In the past 12 months, has lack of transportation kept you from  medical appointments or from getting medications?: No    Lack of Transportation (Non-Medical): No  Physical Activity: Insufficiently Active (09/22/2022)   Exercise Vital Sign    Days of Exercise per Week: 4 days    Minutes of Exercise per Session: 30 min  Stress: No Stress Concern Present (09/22/2022)   [employer] of Occupational Health - Occupational Stress Questionnaire    Feeling of Stress : Only a little  Social Connections: Socially Integrated (09/22/2022)   Social Connection and Isolation Panel [NHANES]    Frequency of Communication with Friends and Family: More than three times a week    Frequency of Social Gatherings with Friends and Family: More than three times a week    Attends Religious Services: More than 4 times per year    Active Member of [employer] or Organizations: Yes    Attends Engineer, structural: More than 4 times per year    Marital Status: Married     Review of Systems  Constitutional:  Negative for appetite change and unexpected weight change.  HENT:  Negative for congestion, sinus pressure and sore throat.   Eyes:  Negative for pain and visual disturbance.  Respiratory:  Negative for cough, chest tightness and shortness of breath.   Cardiovascular:  Negative for chest pain and palpitations.  Gastrointestinal:  Negative for abdominal pain, diarrhea, nausea and vomiting.  Genitourinary:  Negative for difficulty urinating and dysuria.  Musculoskeletal:  Negative for joint swelling and myalgias.  Skin:  Negative for color change and rash.  Neurological:  Negative for dizziness and headaches.  Hematological:  Negative for adenopathy. Does not bruise/bleed easily.  Psychiatric/Behavioral:  Negative for agitation and dysphoric mood.        Objective:     BP 128/70   Pulse 70   Temp 98.2 F (36.8 C)   Resp 16   Ht 5\' 3"  (1.6 m)   Wt 121 lb 6.4 oz (55.1 kg)   SpO2 98%   BMI 21.51 kg/m  Wt Readings from Last 3 Encounters:  03/31/23 121 lb 6.4  oz (55.1 kg)  09/23/22 117 lb 3.2 oz (53.2 kg)  08/04/22 117 lb (53.1 kg)    Physical Exam Vitals reviewed.  Constitutional:      General: She is not in acute distress.    Appearance: Normal appearance. She is well-developed.  HENT:     Head: Normocephalic and atraumatic.     Right Ear: External ear normal.     Left Ear: External ear normal.     Mouth/Throat:     Pharynx: No oropharyngeal exudate or posterior oropharyngeal erythema.  Eyes:  General: No scleral icterus.       Right eye: No discharge.        Left eye: No discharge.     Conjunctiva/sclera: Conjunctivae normal.  Neck:     Thyroid: No thyromegaly.  Cardiovascular:     Rate and Rhythm: Normal rate and regular rhythm.  Pulmonary:     Effort: No tachypnea, accessory muscle usage or respiratory distress.     Breath sounds: Normal breath sounds. No decreased breath sounds or wheezing.  Chest:  Breasts:    Right: No inverted nipple, mass, nipple discharge or tenderness (no axillary adenopathy).     Left: No inverted nipple, mass, nipple discharge or tenderness (no axilarry adenopathy).  Abdominal:     General: Bowel sounds are normal.     Palpations: Abdomen is soft.     Tenderness: There is no abdominal tenderness.  Musculoskeletal:        General: No swelling or tenderness.     Cervical back: Neck supple.  Lymphadenopathy:     Cervical: No cervical adenopathy.  Skin:    Findings: No erythema or rash.  Neurological:     Mental Status: She is alert and oriented to person, place, and time.  Psychiatric:        Mood and Affect: Mood normal.        Behavior: Behavior normal.      Outpatient Encounter Medications as of 03/31/2023  Medication Sig   [DISCONTINUED] estradiol (ESTRACE VAGINAL) 0.1 MG/GM vaginal cream Use one applicator q day x 5 days and then continue two times per week.   estradiol (ESTRACE VAGINAL) 0.1 MG/GM vaginal cream Use one applicator q day x 5 days and then continue two times per week.    No facility-administered encounter medications on file as of 03/31/2023.     Lab Results  Component Value Date   WBC 4.9 03/25/2023   HGB 12.8 03/25/2023   HCT 38.8 03/25/2023   PLT 299.0 03/25/2023   GLUCOSE 89 03/25/2023   CHOL 253 (H) 03/25/2023   TRIG 68.0 03/25/2023   HDL 75.50 03/25/2023   LDLCALC 164 (H) 03/25/2023   ALT 21 03/25/2023   AST 23 03/25/2023   NA 140 03/25/2023   K 4.0 03/25/2023   CL 106 03/25/2023   CREATININE 0.64 03/25/2023   BUN 18 03/25/2023   CO2 28 03/25/2023   TSH 2.13 03/25/2023    [place] LT BREAST BX W LOC DEV 1ST LESION IMG BX SPEC US GUIDE  Addendum Date: 05/15/2022   ADDENDUM REPORT: 05/15/2022 17:14 ADDENDUM: PATHOLOGY revealed: A. BREAST, LEFT AT 3:00, RETROAREOLAR; ULTRASOUND-GUIDED CORE NEEDLE BIOPSY: - BENIGN MAMMARY PARENCHYMA DISPLAYING CYSTICALLY DILATED DUCT SPACE WITH ABUNDANT FOAMY MACROPHAGES, WITH ASSOCIATED DENSE FIBROSIS AND CHRONIC INFLAMMATION, MOST SUGGESTIVE OF DUCT ECTASIA. - NEGATIVE FOR ATYPICAL PROLIFERATIVE BREAST DISEASE. Pathology results are CONCORDANT with imaging findings, per Dr. Gerome Sam. Pathology results and recommendations were discussed with patient via telephone on 05/14/2022. Patient reported biopsy site doing well with no adverse symptoms, and only slight tenderness at the site. Post biopsy care instructions were reviewed, questions were answered and my direct phone number was provided. Patient was instructed to call [facility] for any additional questions or concerns related to biopsy site. RECOMMENDATION: Patient instructed to continue monthly self breast examinations and resume annual bilateral screening mammogram due January 2025. Pathology results reported by Randa Lynn RN on 05/15/2022. Electronically Signed   By: Gerome Sam III M.D.   On: 05/15/2022 17:14  Result Date: 05/15/2022 CLINICAL DATA:  Biopsy of a 3 o'clock retroareolar left breast mass. EXAM: ULTRASOUND GUIDED LEFT BREAST CORE  NEEDLE BIOPSY COMPARISON:  Previous exam(s). PROCEDURE: I met with the patient and we discussed the procedure of ultrasound-guided biopsy, including benefits and alternatives. We discussed the high likelihood of a successful procedure. We discussed the risks of the procedure, including infection, bleeding, tissue injury, clip migration, and inadequate sampling. Informed written consent was given. The usual time-out protocol was performed immediately prior to the procedure. Lesion quadrant: 3 o'clock left breast Using sterile technique and 1% Lidocaine as local anesthetic, under direct ultrasound visualization, a 14 gauge spring-loaded device was used to perform biopsy of a 3 o'clock left breast mass using a lateral approach. At the conclusion of the procedure a ribbon shaped tissue marker clip was deployed into the biopsy cavity. Follow up 2 view mammogram was performed and dictated separately. IMPRESSION: Ultrasound guided biopsy of a 3 o'clock left breast mass. No apparent complications. Electronically Signed: By: Gerome Sam III M.D. On: 05/13/2022 09:35  MM CLIP PLACEMENT LEFT  Result Date: 05/13/2022 CLINICAL DATA:  Evaluate biopsy marker EXAM: 3D DIAGNOSTIC LEFT MAMMOGRAM POST ULTRASOUND BIOPSY COMPARISON:  Previous exam(s). FINDINGS: 3D Mammographic images were obtained following ultrasound guided biopsy of a left breast mass. The biopsy marking clip is in expected position at the site of biopsy. IMPRESSION: Appropriate positioning of the ribbon shaped biopsy marking clip at the site of biopsy in the biopsied left breast mass. Final Assessment: Post Procedure Mammograms for Marker Placement Electronically Signed   By: Gerome Sam III M.D.   On: 05/13/2022 09:38      Assessment & Plan:  Encounter for screening mammogram for malignant neoplasm of breast -     3D Screening Mammogram w/Implants, Left and Right; Future  Health care maintenance Assessment & Plan: Physical today 03/31/23.  PAP  03/11/21 - negative HPV. Atrophy.  Mammogram 04/2022 - biopsy recommended - ok.  Recommended f/u annual screening mammogram due in 04/2023.  Colonoscopy 05/2017.  Recommended f/up in 10 years.    Osteoporosis without current pathological fracture, unspecified osteoporosis type Assessment & Plan: Saw endocrinology 10/2022 - discussed osteoporosis treatment. Recommended f/u bone density and then discuss treatment. Bone density scheduled for 04/20/23.   Hypercholesterolemia Assessment & Plan: The 10-year ASCVD risk score (Arnett DK, et al., 2019) is: 6.4%   Values used to calculate the score:     Age: 53 years     Sex: Female     Is Non-Hispanic African American: No     Diabetic: No     Tobacco smoker: No     Systolic Blood Pressure: 116 mmHg     Is BP treated: No     HDL Cholesterol: 75.5 mg/dL     Total Cholesterol: 253 mg/dL  Low cholesterol diet and exercise.  Follow lipid panel.   Orders: -     Lipid panel; Future -     Hepatic function panel; Future -     Basic metabolic panel; Future  Other orders -     Estradiol; Use one applicator q day x 5 days and then continue two times per week.  Dispense: 42.5 g; Refill: 2     Dale Scanlon, MD

## 2023-03-31 NOTE — Assessment & Plan Note (Signed)
Saw endocrinology 10/2022 - discussed osteoporosis treatment. Recommended f/u bone density and then discuss treatment. Bone density scheduled for 04/20/23.

## 2023-03-31 NOTE — Assessment & Plan Note (Addendum)
Physical today 03/31/23.  PAP 03/11/21 - negative HPV. Atrophy.  Mammogram 04/2022 - biopsy recommended - ok.  Recommended f/u annual screening mammogram due in 04/2023.  Colonoscopy 05/2017.  Recommended f/up in 10 years.

## 2023-04-05 ENCOUNTER — Encounter: Payer: Self-pay | Admitting: Internal Medicine

## 2023-04-20 ENCOUNTER — Other Ambulatory Visit: Payer: Medicare HMO

## 2023-04-28 ENCOUNTER — Ambulatory Visit
Admission: RE | Admit: 2023-04-28 | Discharge: 2023-04-28 | Disposition: A | Discharge status: Home / Self Care | Payer: Medicare HMO | Source: Ambulatory Visit | Attending: Internal Medicine | Admitting: Internal Medicine

## 2023-04-28 DIAGNOSIS — Z1231 Encounter for screening mammogram for malignant neoplasm of breast: Secondary | ICD-10-CM | POA: Insufficient documentation

## 2023-06-24 ENCOUNTER — Other Ambulatory Visit: Payer: Medicare HMO

## 2023-06-27 HISTORY — PX: OTHER SURGICAL HISTORY: SHX169

## 2023-07-22 DIAGNOSIS — D2262 Melanocytic nevi of left upper limb, including shoulder: Secondary | ICD-10-CM | POA: Diagnosis not present

## 2023-07-22 DIAGNOSIS — D485 Neoplasm of uncertain behavior of skin: Secondary | ICD-10-CM | POA: Diagnosis not present

## 2023-07-22 DIAGNOSIS — D2271 Melanocytic nevi of right lower limb, including hip: Secondary | ICD-10-CM | POA: Diagnosis not present

## 2023-07-22 DIAGNOSIS — D2261 Melanocytic nevi of right upper limb, including shoulder: Secondary | ICD-10-CM | POA: Diagnosis not present

## 2023-07-22 DIAGNOSIS — Z85828 Personal history of other malignant neoplasm of skin: Secondary | ICD-10-CM | POA: Diagnosis not present

## 2023-07-22 DIAGNOSIS — Z85820 Personal history of malignant melanoma of skin: Secondary | ICD-10-CM | POA: Diagnosis not present

## 2023-07-22 DIAGNOSIS — D2272 Melanocytic nevi of left lower limb, including hip: Secondary | ICD-10-CM | POA: Diagnosis not present

## 2023-07-22 DIAGNOSIS — D2362 Other benign neoplasm of skin of left upper limb, including shoulder: Secondary | ICD-10-CM | POA: Diagnosis not present

## 2023-07-22 DIAGNOSIS — D0439 Carcinoma in situ of skin of other parts of face: Secondary | ICD-10-CM | POA: Diagnosis not present

## 2023-08-11 ENCOUNTER — Other Ambulatory Visit: Payer: Medicare HMO

## 2023-08-25 ENCOUNTER — Ambulatory Visit (INDEPENDENT_AMBULATORY_CARE_PROVIDER_SITE_OTHER): Payer: Medicare HMO | Admitting: *Deleted

## 2023-08-25 VITALS — Ht 63.0 in | Wt 114.0 lb

## 2023-08-25 DIAGNOSIS — Z0000 Encounter for general adult medical examination without abnormal findings: Secondary | ICD-10-CM

## 2023-08-25 NOTE — Progress Notes (Signed)
 Subjective:   Lisa Chen is a 68 y.o. who presents for a Medicare Wellness preventive visit.  Visit Complete: Virtual I connected with  Lisa Chen on 08/25/23 by a audio enabled telemedicine application and verified that I am speaking with the correct person using two identifiers.  Patient Location: Home  Provider Location: Office/Clinic  I discussed the limitations of evaluation and management by telemedicine. The patient expressed understanding and agreed to proceed.  Vital Signs: Because this visit was a virtual/telehealth visit, some criteria may be missing or patient reported. Any vitals not documented were not able to be obtained and vitals that have been documented are patient reported.  VideoDeclined- This patient declined Librarian, academic. Therefore the visit was completed with audio only.  Persons Participating in Visit: Patient.  AWV Questionnaire: No: Patient Medicare AWV questionnaire was not completed prior to this visit.  Cardiac Risk Factors include: advanced age (>11men, >48 women);dyslipidemia     Objective:    Today's Vitals   08/25/23 1017  Weight: 114 lb (51.7 kg)  Height: 5\' 3"  (1.6 m)   Body mass index is 20.19 kg/m.     08/25/2023   10:32 AM 08/04/2022    2:11 PM 07/18/2021    3:42 PM 06/24/2017    7:52 AM 04/03/2015   10:20 AM  Advanced Directives  Does Patient Have a Medical Advance Directive? Yes Yes Yes Yes Yes  Type of Estate agent of [place];Living will Healthcare Power of [place];Living will Healthcare Power of [place];Living will Healthcare Power of [place];Living will Healthcare Power of [place];Living will  Does patient want to make changes to medical advance directive?  No - Patient declined No - Patient declined  No - Patient declined  Copy of Healthcare Power of Attorney in Chart? No - copy requested No - copy requested No - copy requested Yes No - copy requested     Current Medications (verified) Outpatient Encounter Medications as of 08/25/2023  Medication Sig   calcium carbonate (OSCAL) 1500 (600 Ca) MG TABS tablet Take by mouth. Takes 5 times a week   cholecalciferol (VITAMIN D3) 25 MCG (1000 UNIT) tablet Take 1,000 Units by mouth. Takes about 5 times a week   estradiol  (ESTRACE  VAGINAL) 0.1 MG/GM vaginal cream Use one applicator q day x 5 days and then continue two times per week.   No facility-administered encounter medications on file as of 08/25/2023.    Allergies (verified) Erythromycin and Other   History: Past Medical History:  Diagnosis Date   Cancer (HCC)    melanoma   Headache    3/WEEK, / DUE TO EYELIDS   Osteopenia    NOT PROGRESSED IN LAST 10 YEARS   Ptosis, both eyelids    UPPER   Past Surgical History:  Procedure Laterality Date   ABDOMINAL HYSTERECTOMY  1995   AUGMENTATION MAMMAPLASTY Bilateral 2000   SALINE   BREAST BIOPSY Left 05/13/2022   us  biopsy/ribbon clip/ benign   BREAST BIOPSY Left 05/13/2022   US  LT BREAST BX W LOC DEV 1ST LESION IMG BX SPEC US  GUIDE 05/13/2022 [facility]   BREAST EXCISIONAL BIOPSY Right 1990's   NEG   BREAST SURGERY Right    BIOPSY   BROW LIFT Bilateral 04/03/2015   Procedure: BLEPHAROPLASTY;  Surgeon: Zacarias Hermann, MD;  Location: [facility];  Service: Ophthalmology;  Laterality: Bilateral;   CERVICAL DISC SURGERY  2001   COLONOSCOPY WITH PROPOFOL  N/A 06/24/2017   Procedure: COLONOSCOPY WITH  PROPOFOL ;  Surgeon: Marshall Skeeter, MD;  Location: [facility];  Service: Endoscopy;  Laterality: N/A;   COSMETIC SURGERY     SKIN CANCER-MELANOMA ON BACK REMOVED-BASAL CELL ON CHEST ALSO   EYE SURGERY Bilateral 2000 AND 2002   CATARACTS   FOOT SURGERY     HERNIA REPAIR  1984   PTOSIS REPAIR Bilateral 04/03/2015   Procedure: PTOSIS REPAIR;  Surgeon: Zacarias Hermann, MD;  Location: [facility];  Service: Ophthalmology;  Laterality: Bilateral;   skin cancer removed   06/2023   shoulder   TONSILLECTOMY     Family History  Problem Relation Age of Onset   Arthritis Mother    Heart disease Mother    Diabetes Mother    Heart attack Father    Heart disease Maternal Grandmother    Heart disease Maternal Grandfather    Diabetes Maternal Grandfather    Colon cancer Cousin    Colon cancer Maternal Uncle    Heart attack Maternal Uncle    Stroke Maternal Uncle    Breast cancer Neg Hx    Social History   Socioeconomic History   Marital status: Married    Spouse name: Not on file   Number of children: Not on file   Years of education: Not on file   Highest education level: 12th grade  Occupational History   Not on file  Tobacco Use   Smoking status: Never   Smokeless tobacco: Never  Vaping Use   Vaping status: Never Used  Substance and Sexual Activity   Alcohol use: Yes    Alcohol/week: 3.0 standard drinks of alcohol    Types: 3 Glasses of wine per week    Comment: OCCAS. none last 24hrs   Drug use: No   Sexual activity: Not on file  Other Topics Concern   Not on file  Social History Narrative   married   Social Drivers of Health   Financial Resource Strain: Low Risk  (08/25/2023)   Overall Financial Resource Strain (CARDIA)    Difficulty of Paying Living Expenses: Not hard at all  Food Insecurity: No Food Insecurity (08/25/2023)   Hunger Vital Sign    Worried About Running Out of Food in the Last Year: Never true    Ran Out of Food in the Last Year: Never true  Transportation Needs: No Transportation Needs (08/25/2023)   PRAPARE - Administrator, Civil Service (Medical): No    Lack of Transportation (Non-Medical): No  Physical Activity: Inactive (08/25/2023)   Exercise Vital Sign    Days of Exercise per Week: 0 days    Minutes of Exercise per Session: 0 min  Stress: No Stress Concern Present (08/25/2023)   [employer] of Occupational Health - Occupational Stress Questionnaire    Feeling of Stress : Not at all   Social Connections: Socially Integrated (08/25/2023)   Social Connection and Isolation Panel [NHANES]    Frequency of Communication with Friends and Family: More than three times a week    Frequency of Social Gatherings with Friends and Family: More than three times a week    Attends Religious Services: More than 4 times per year    Active Member of [employer] or Organizations: Yes    Attends Engineer, structural: More than 4 times per year    Marital Status: Married    Tobacco Counseling Counseling given: Not Answered    Clinical Intake:  Pre-visit preparation completed: Yes  Pain : No/denies pain  BMI - recorded: 20.19 Nutritional Status: BMI of 19-24  Normal Nutritional Risks: None Diabetes: No  No results found for: "HGBA1C"   How often do you need to have someone help you when you read instructions, pamphlets, or other written materials from your doctor or pharmacy?: 1 - Never  Interpreter Needed?: No  Information entered by :: R. Kristian Mogg LPN   Activities of Daily Living     08/25/2023   10:19 AM  In your present state of health, do you have any difficulty performing the following activities:  Hearing? 0  Vision? 0  Comment readers  Difficulty concentrating or making decisions? 0  Walking or climbing stairs? 0  Dressing or bathing? 0  Doing errands, shopping? 0  Preparing Food and eating ? N  Using the Toilet? N  In the past six months, have you accidently leaked urine? N  Do you have problems with loss of bowel control? N  Managing your Medications? N  Managing your Finances? N  Housekeeping or managing your Housekeeping? N    Patient Care Team: Dellar Fenton, MD as PCP - General (Internal Medicine) Dellar Fenton, MD (Internal Medicine) Marquita Situ Magali Schmitz, MD (General Surgery)  Indicate any recent Medical Services you may have received from other than [facility] providers in the past year (date may be approximate).     Assessment:   This is a  routine wellness examination for Lisa Chen.  Hearing/Vision screen Hearing Screening - Comments:: No issues Vision Screening - Comments:: Readers at times   Goals Addressed             This Visit's Progress    Patient Stated       Would like to start a regular walking exercise program        Depression Screen     08/25/2023   10:25 AM 08/04/2022    2:08 PM 10/01/2021    2:59 PM 07/18/2021    3:44 PM 03/11/2021    9:07 AM 07/03/2020    1:49 PM 03/07/2020    8:35 AM  PHQ 2/9 Scores  PHQ - 2 Score 0 0 0 0 0 0 0  PHQ- 9 Score 3          Fall Risk     08/25/2023   10:20 AM 08/04/2022    2:12 PM 10/01/2021    2:59 PM 07/18/2021    3:44 PM 07/03/2020    1:52 PM  Fall Risk   Falls in the past year? 0 0 0 0 0  Number falls in past yr: 0 0  0 0  Injury with Fall? 0 0   0  Risk for fall due to : No Fall Risks  No Fall Risks    Follow up Falls prevention discussed;Falls evaluation completed Falls evaluation completed;Falls prevention discussed Falls evaluation completed Falls evaluation completed Falls evaluation completed    MEDICARE RISK AT HOME:  Medicare Risk at Home Any stairs in or around the home?: Yes If so, are there any without handrails?: Yes (outside 3 steps) Home free of loose throw rugs in walkways, pet beds, electrical cords, etc?: Yes Adequate lighting in your home to reduce risk of falls?: Yes Life alert?: No Use of a cane, walker or w/c?: No Grab bars in the bathroom?: No Shower chair or bench in shower?: Yes Elevated toilet seat or a handicapped toilet?: No  TIMED UP AND GO:  Was the test performed?  No  Cognitive Function: 6CIT completed  08/25/2023   10:33 AM 08/04/2022    2:13 PM  6CIT Screen  What Year? 0 points 0 points  What month? 0 points 0 points  What time? 0 points 0 points  Count back from 20 0 points 0 points  Months in reverse 0 points 0 points  Repeat phrase 0 points 0 points  Total Score 0 points 0 points     Immunizations Immunization History  Administered Date(s) Administered   Influenza, High Dose Seasonal PF 02/18/2021, 02/12/2023   Influenza,inj,Quad PF,6+ Mos 12/26/2016, 12/30/2017, 01/10/2019, 03/07/2020   PFIZER Comirnaty(Gray Top)Covid-19 Tri-Sucrose Vaccine 06/03/2019, 06/24/2019, 04/23/2020   Zoster Recombinant(Shingrix) 11/20/2021, 02/27/2022    Screening Tests Health Maintenance  Topic Date Due   Hepatitis C Screening  Never done   DTaP/Tdap/Td (1 - Tdap) Never done   COVID-19 Vaccine (4 - 2024-25 season) 12/28/2022   Medicare Annual Wellness (AWV)  08/04/2023   Pneumonia Vaccine 40+ Years old (1 of 1 - PCV) 03/30/2024 (Originally 05/29/2004)   INFLUENZA VACCINE  11/27/2023   MAMMOGRAM  04/27/2025   Colonoscopy  06/25/2027   DEXA SCAN  Completed   Zoster Vaccines- Shingrix  Completed   HPV VACCINES  Aged Out   Meningococcal B Vaccine  Aged Out    Health Maintenance  Health Maintenance Due  Topic Date Due   Hepatitis C Screening  Never done   DTaP/Tdap/Td (1 - Tdap) Never done   COVID-19 Vaccine (4 - 2024-25 season) 12/28/2022   Medicare Annual Wellness (AWV)  08/04/2023   Health Maintenance Items Addressed: Discussed the need to update Tetanus, pneumonia and covid vaccines .  Patient wants to discuss Hepatitis C screening with her PCP at next visit.  Additional Screening:  Vision Screening: Recommended annual ophthalmology exams for early detection of glaucoma and other disorders of the eye. Patient is overdue but will call and schedule an appointment. [facility]  Dental Screening: Recommended annual dental exams for proper oral hygiene  Community Resource Referral / Chronic Care Management: CRR required this visit?  No   CCM required this visit?  No     Plan:     I have personally reviewed and noted the following in the patient's chart:   Medical and social history Use of alcohol, tobacco or illicit drugs  Current medications and supplements  including opioid prescriptions. Patient is not currently taking opioid prescriptions. Functional ability and status Nutritional status Physical activity Advanced directives List of other physicians Hospitalizations, surgeries, and ER visits in previous 12 months Vitals Screenings to include cognitive, depression, and falls Referrals and appointments  In addition, I have reviewed and discussed with patient certain preventive protocols, quality metrics, and best practice recommendations. A written personalized care plan for preventive services as well as general preventive health recommendations were provided to patient.     Felicitas Horse, LPN   1/61/0960   After Visit Summary: (MyChart) Due to this being a telephonic visit, the after visit summary with patients personalized plan was offered to patient via MyChart   Notes: Nothing significant to report at this time.

## 2023-08-25 NOTE — Patient Instructions (Signed)
 [name] , Thank you for taking time to come for your Medicare Wellness Visit. I appreciate your ongoing commitment to your health goals. Please review the following plan we discussed and let me know if I can assist you in the future.   Referrals/Orders/Follow-Ups/Clinician Recommendations: Remember to call and schedule an eye appointment. You also need to update your Tetanus (tdap), pneumonia and covid vaccines.  This is a list of the screening recommended for you and due dates:  Health Maintenance  Topic Date Due   Hepatitis C Screening  Never done   DTaP/Tdap/Td vaccine (1 - Tdap) Never done   COVID-19 Vaccine (4 - 2024-25 season) 12/28/2022   Pneumonia Vaccine (1 of 1 - PCV) 03/30/2024*   Flu Shot  11/27/2023   Medicare Annual Wellness Visit  08/24/2024   Mammogram  04/27/2025   Colon Cancer Screening  06/25/2027   DEXA scan (bone density measurement)  Completed   Zoster (Shingles) Vaccine  Completed   HPV Vaccine  Aged Out   Meningitis B Vaccine  Aged Out  *Topic was postponed. The date shown is not the original due date.    Advanced directives: (Copy Requested) Please bring a copy of your health care power of attorney and living will to the office to be added to your chart at your convenience. You can mail to [facility] [address] [place], [place] [address] or email to [email or web address]  Next Medicare Annual Wellness Visit scheduled for next year: Yes 08/30/24 @ 9:30

## 2023-09-07 DIAGNOSIS — L820 Inflamed seborrheic keratosis: Secondary | ICD-10-CM | POA: Diagnosis not present

## 2023-09-07 DIAGNOSIS — L538 Other specified erythematous conditions: Secondary | ICD-10-CM | POA: Diagnosis not present

## 2023-09-08 ENCOUNTER — Ambulatory Visit
Admission: RE | Admit: 2023-09-08 | Discharge: 2023-09-08 | Disposition: A | Discharge status: Home / Self Care | Source: Ambulatory Visit | Attending: Endocrinology | Admitting: Endocrinology

## 2023-09-08 DIAGNOSIS — M810 Age-related osteoporosis without current pathological fracture: Secondary | ICD-10-CM | POA: Diagnosis not present

## 2023-09-16 DIAGNOSIS — Z1331 Encounter for screening for depression: Secondary | ICD-10-CM | POA: Diagnosis not present

## 2023-09-16 DIAGNOSIS — M8589 Other specified disorders of bone density and structure, multiple sites: Secondary | ICD-10-CM | POA: Diagnosis not present

## 2023-09-23 DIAGNOSIS — L905 Scar conditions and fibrosis of skin: Secondary | ICD-10-CM | POA: Diagnosis not present

## 2023-09-25 ENCOUNTER — Ambulatory Visit: Payer: Self-pay | Admitting: Internal Medicine

## 2023-09-25 ENCOUNTER — Other Ambulatory Visit (INDEPENDENT_AMBULATORY_CARE_PROVIDER_SITE_OTHER): Payer: Medicare HMO

## 2023-09-25 DIAGNOSIS — E7800 Pure hypercholesterolemia, unspecified: Secondary | ICD-10-CM | POA: Diagnosis not present

## 2023-09-25 LAB — LIPID PANEL
Cholesterol: 251 mg/dL — ABNORMAL HIGH (ref 0–200)
HDL: 76.5 mg/dL (ref >39.00)
LDL Cholesterol: 160 mg/dL — ABNORMAL HIGH (ref 0–99)
NonHDL: 174.81
Total CHOL/HDL Ratio: 3
Triglycerides: 72 mg/dL (ref 0.0–149.0)
VLDL: 14.4 mg/dL (ref 0.0–40.0)

## 2023-09-25 LAB — BASIC METABOLIC PANEL WITH GFR
BUN: 17 mg/dL (ref 6–23)
CO2: 28 meq/L (ref 19–32)
Calcium: 9.1 mg/dL (ref 8.4–10.5)
Chloride: 105 meq/L (ref 96–112)
Creatinine, Ser: 0.72 mg/dL (ref 0.40–1.20)
GFR: 85.37 mL/min (ref >60.00)
Glucose, Bld: 96 mg/dL (ref 70–99)
Potassium: 4.1 meq/L (ref 3.5–5.1)
Sodium: 140 meq/L (ref 135–145)

## 2023-09-25 LAB — HEPATIC FUNCTION PANEL
ALT: 17 U/L (ref 0–35)
AST: 21 U/L (ref 0–37)
Albumin: 4.4 g/dL (ref 3.5–5.2)
Alkaline Phosphatase: 62 U/L (ref 39–117)
Bilirubin, Direct: 0.1 mg/dL (ref 0.0–0.3)
Total Bilirubin: 0.8 mg/dL (ref 0.2–1.2)
Total Protein: 7.2 g/dL (ref 6.0–8.3)

## 2023-09-29 ENCOUNTER — Ambulatory Visit (INDEPENDENT_AMBULATORY_CARE_PROVIDER_SITE_OTHER): Payer: Medicare HMO | Admitting: Internal Medicine

## 2023-09-29 ENCOUNTER — Encounter: Payer: Self-pay | Admitting: Internal Medicine

## 2023-09-29 VITALS — BP 108/68 | HR 76 | Temp 98.0°F | Resp 16 | Ht 63.0 in | Wt 117.6 lb

## 2023-09-29 DIAGNOSIS — R232 Flushing: Secondary | ICD-10-CM | POA: Diagnosis not present

## 2023-09-29 DIAGNOSIS — F439 Reaction to severe stress, unspecified: Secondary | ICD-10-CM | POA: Diagnosis not present

## 2023-09-29 DIAGNOSIS — M8580 Other specified disorders of bone density and structure, unspecified site: Secondary | ICD-10-CM | POA: Diagnosis not present

## 2023-09-29 DIAGNOSIS — E7800 Pure hypercholesterolemia, unspecified: Secondary | ICD-10-CM

## 2023-09-29 DIAGNOSIS — Z800 Family history of malignant neoplasm of digestive organs: Secondary | ICD-10-CM | POA: Diagnosis not present

## 2023-09-29 MED ORDER — ESTRADIOL 0.1 MG/GM VA CREA: TOPICAL_CREAM | VAGINAL | 2 refills | Status: DC

## 2023-09-29 NOTE — Assessment & Plan Note (Signed)
 The 10-year ASCVD risk score (Arnett DK, et al., 2019) is: 6.2%   Values used to calculate the score:     Age: 68 years     Sex: Female     Is Non-Hispanic African American: No     Diabetic: No     Tobacco smoker: No     Systolic Blood Pressure: 108 mmHg     Is BP treated: No     HDL Cholesterol: 76.5 mg/dL     Total Cholesterol: 251 mg/dL  Low cholesterol diet and exercise.  Follow lipid panel. Discussed calculated cholesterol risk. Discussed calcium score 0 in 2021. Discussed f/u calcium score. Will notify me if desires to schedule.

## 2023-09-29 NOTE — Assessment & Plan Note (Signed)
 Off oral estrogen/patch. Discussed with her today  Have discussed being placed back on estrogen and have discussed a trial of effexor.  Wants to hold on any further intervention at this time.

## 2023-09-29 NOTE — Assessment & Plan Note (Signed)
 Saw endocrinology 09/16/23 - recent bone denstiy - osteopenia. Recommended calcium, vitamin D  and weight bearing exercise. Recommended f/u bone density in 2027.  Discussed bone density.  Discussed calcium, vitamin D  and weightbearing exercise.  Information given on foods with increased calcium.

## 2023-09-29 NOTE — Assessment & Plan Note (Signed)
 Increased stress as outlined.  Discussed.

## 2023-09-29 NOTE — Progress Notes (Signed)
 Subjective:    Patient ID: Lisa Chen, female    DOB: 1955/11/03, 68 y.o.   MRN: [id number]  Patient here for  Chief Complaint  Patient presents with   Medical Management of Chronic Issues    HPI Here for a scheduled follow up - follow up regarding hypercholesterolemia. Saw endocrinology 09/16/23 - recent bone denstiy - osteopenia. Recommended calcium, vitamin D  and weight bearing exercise. Recommended f/u bone density in 2027.  Discussed bone density.  Discussed calcium, vitamin D  and weightbearing exercise.  Information given on foods with increased calcium.  Lisa Chen stays active.  No chest pain or shortness of breath with increased activity or exertion.  No abdominal pain or bowel change reported.  Increase stress.  Discussed.  Overall Lisa Chen appears to be handling things relatively well.  Discussed her cholesterol and recent labs.  Discussed calcium score.   Past Medical History:  Diagnosis Date   Cancer (HCC)    melanoma   Headache    3/WEEK, / DUE TO EYELIDS   Osteopenia    NOT PROGRESSED IN LAST 10 YEARS   Ptosis, both eyelids    UPPER   Past Surgical History:  Procedure Laterality Date   ABDOMINAL HYSTERECTOMY  1995   AUGMENTATION MAMMAPLASTY Bilateral 2000   SALINE   BREAST BIOPSY Left 05/13/2022   us  biopsy/ribbon clip/ benign   BREAST BIOPSY Left 05/13/2022   US  LT BREAST BX W LOC DEV 1ST LESION IMG BX SPEC US  GUIDE 05/13/2022 [facility]   BREAST EXCISIONAL BIOPSY Right 1990's   NEG   BREAST SURGERY Right    BIOPSY   BROW LIFT Bilateral 04/03/2015   Procedure: BLEPHAROPLASTY;  Surgeon: Zacarias Hermann, MD;  Location: [facility];  Service: Ophthalmology;  Laterality: Bilateral;   CERVICAL DISC SURGERY  2001   COLONOSCOPY WITH PROPOFOL  N/A 06/24/2017   Procedure: COLONOSCOPY WITH PROPOFOL ;  Surgeon: Marshall Skeeter, MD;  Location: [facility];  Service: Endoscopy;  Laterality: N/A;   COSMETIC SURGERY     SKIN CANCER-MELANOMA ON BACK REMOVED-BASAL  CELL ON CHEST ALSO   EYE SURGERY Bilateral 2000 AND 2002   CATARACTS   FOOT SURGERY     HERNIA REPAIR  1984   PTOSIS REPAIR Bilateral 04/03/2015   Procedure: PTOSIS REPAIR;  Surgeon: Zacarias Hermann, MD;  Location: [facility];  Service: Ophthalmology;  Laterality: Bilateral;   skin cancer removed  06/2023   shoulder   TONSILLECTOMY     Family History  Problem Relation Age of Onset   Arthritis Mother    Heart disease Mother    Diabetes Mother    Heart attack Father    Heart disease Maternal Grandmother    Heart disease Maternal Grandfather    Diabetes Maternal Grandfather    Colon cancer Cousin    Colon cancer Maternal Uncle    Heart attack Maternal Uncle    Stroke Maternal Uncle    Breast cancer Neg Hx    Social History   Socioeconomic History   Marital status: Married    Spouse name: Not on file   Number of children: Not on file   Years of education: Not on file   Highest education level: 12th grade  Occupational History   Not on file  Tobacco Use   Smoking status: Never   Smokeless tobacco: Never  Vaping Use   Vaping status: Never Used  Substance and Sexual Activity   Alcohol use: Yes    Alcohol/week: 3.0 standard drinks of  alcohol    Types: 3 Glasses of wine per week    Comment: OCCAS. none last 24hrs   Drug use: No   Sexual activity: Not on file  Other Topics Concern   Not on file  Social History Narrative   married   Social Drivers of Health   Financial Resource Strain: Low Risk  (09/25/2023)   Overall Financial Resource Strain (CARDIA)    Difficulty of Paying Living Expenses: Not hard at all  Food Insecurity: No Food Insecurity (09/25/2023)   Hunger Vital Sign    Worried About Running Out of Food in the Last Year: Never true    Ran Out of Food in the Last Year: Never true  Transportation Needs: No Transportation Needs (09/25/2023)   PRAPARE - Administrator, Civil Service (Medical): No    Lack of Transportation (Non-Medical): No   Physical Activity: Insufficiently Active (09/25/2023)   Exercise Vital Sign    Days of Exercise per Week: 4 days    Minutes of Exercise per Session: 30 min  Stress: No Stress Concern Present (09/25/2023)   [employer] of Occupational Health - Occupational Stress Questionnaire    Feeling of Stress : Only a little  Social Connections: Socially Integrated (09/25/2023)   Social Connection and Isolation Panel [NHANES]    Frequency of Communication with Friends and Family: More than three times a week    Frequency of Social Gatherings with Friends and Family: More than three times a week    Attends Religious Services: More than 4 times per year    Active Member of [employer] or Organizations: Yes    Attends Engineer, structural: More than 4 times per year    Marital Status: Married     Review of Systems  Constitutional:  Negative for appetite change and unexpected weight change.  HENT:  Negative for congestion and sinus pressure.   Respiratory:  Negative for cough, chest tightness and shortness of breath.   Cardiovascular:  Negative for chest pain, palpitations and leg swelling.  Gastrointestinal:  Negative for abdominal pain, diarrhea, nausea and vomiting.  Genitourinary:  Negative for difficulty urinating and dysuria.  Musculoskeletal:  Negative for joint swelling and myalgias.  Skin:  Negative for color change and rash.  Neurological:  Negative for dizziness and headaches.  Psychiatric/Behavioral:  Negative for agitation and dysphoric mood.        Objective:     BP 108/68   Pulse 76   Temp 98 F (36.7 C)   Resp 16   Ht 5\' 3"  (1.6 m)   Wt 117 lb 9.6 oz (53.3 kg)   SpO2 99%   BMI 20.83 kg/m  Wt Readings from Last 3 Encounters:  09/29/23 117 lb 9.6 oz (53.3 kg)  08/25/23 114 lb (51.7 kg)  03/31/23 121 lb 6.4 oz (55.1 kg)    Physical Exam Vitals reviewed.  Constitutional:      General: Lisa Chen is not in acute distress.    Appearance: Normal appearance.  HENT:      Head: Normocephalic and atraumatic.     Right Ear: External ear normal.     Left Ear: External ear normal.     Mouth/Throat:     Pharynx: No oropharyngeal exudate or posterior oropharyngeal erythema.  Eyes:     General: No scleral icterus.       Right eye: No discharge.        Left eye: No discharge.     Conjunctiva/sclera: Conjunctivae normal.  Neck:     Thyroid : No thyromegaly.  Cardiovascular:     Rate and Rhythm: Normal rate and regular rhythm.  Pulmonary:     Effort: No respiratory distress.     Breath sounds: Normal breath sounds. No wheezing.  Abdominal:     General: Bowel sounds are normal.     Palpations: Abdomen is soft.     Tenderness: There is no abdominal tenderness.  Musculoskeletal:        General: No swelling or tenderness.     Cervical back: Neck supple. No tenderness.  Lymphadenopathy:     Cervical: No cervical adenopathy.  Skin:    Findings: No erythema or rash.  Neurological:     Mental Status: Lisa Chen is alert.  Psychiatric:        Mood and Affect: Mood normal.        Behavior: Behavior normal.         Outpatient Encounter Medications as of 09/29/2023  Medication Sig   calcium carbonate (OSCAL) 1500 (600 Ca) MG TABS tablet Take by mouth. Takes 5 times a week   cholecalciferol (VITAMIN D3) 25 MCG (1000 UNIT) tablet Take 1,000 Units by mouth. Takes about 5 times a week   estradiol  (ESTRACE  VAGINAL) 0.1 MG/GM vaginal cream Use one applicator q day x 5 days and then continue two times per week.   [DISCONTINUED] estradiol  (ESTRACE  VAGINAL) 0.1 MG/GM vaginal cream Use one applicator q day x 5 days and then continue two times per week.   No facility-administered encounter medications on file as of 09/29/2023.     Lab Results  Component Value Date   WBC 4.9 03/25/2023   HGB 12.8 03/25/2023   HCT 38.8 03/25/2023   PLT 299.0 03/25/2023   GLUCOSE 96 09/25/2023   CHOL 251 (H) 09/25/2023   TRIG 72.0 09/25/2023   HDL 76.50 09/25/2023   LDLCALC 160 (H)  09/25/2023   ALT 17 09/25/2023   AST 21 09/25/2023   NA 140 09/25/2023   K 4.1 09/25/2023   CL 105 09/25/2023   CREATININE 0.72 09/25/2023   BUN 17 09/25/2023   CO2 28 09/25/2023   TSH 2.13 03/25/2023    DG BONE DENSITY (DXA) Result Date: 09/08/2023 EXAM: DUAL X-RAY ABSORPTIOMETRY (DXA) FOR BONE MINERAL DENSITY 09/08/2023 11:24 am CLINICAL DATA:  68 year old Female Postmenopausal. Screening for osteoporosis TECHNIQUE: An axial (e.g., hips, spine) and/or appendicular (e.g., radius) exam was performed, as appropriate, using GE Secretary/administrator at [facility]. Images are obtained for bone mineral density measurement and are not obtained for diagnostic purposes. [id number] Exclusions: Lumbar spine due to advanced degenerative changes. COMPARISON:  05/07/2010, 04/16/2021. FINDINGS: Scan quality: Good. LEFT FEMORAL NECK: BMD (in g/cm2): 0.755 T-score: -2.0 Z-score: -0.4 Rate of change from previous exam: -9.6 % LEFT TOTAL HIP: BMD (in g/cm2): 0.803 T-score: -1.6 Z-score: -0.2 RIGHT FEMORAL NECK: BMD (in g/cm2): 0.814 T-score: -1.6 Z-score: 0.0 RIGHT TOTAL HIP: BMD (in g/cm2): 0.829 T-score: -1.4 Z-score: 0.0 DUAL-FEMUR TOTAL MEAN: Rate of change from previous exam: -3.2 % LEFT FOREARM (RADIUS 33%): BMD (in g/cm2): 0.711 T-score: -1.9 Z-score: -0.2 Rate of change from previous exam: -16.1 % FRAX 10-YEAR PROBABILITY OF FRACTURE: 10-year fracture risk is performed using the [facility] FRAX calculator based on patient-reported risk factors. Major osteoporotic fracture: 10.5% Hip fracture: 2.2% Other situations known to alter the reliability of the FRAX score should be considered when making treatment decisions, including chronic glucocorticoid use and past treatments. Further guidance  on treatment can be found at the [facility] Osteoporosis Foundation's website https://www.[email or web address]/. IMPRESSION: Osteopenia based on BMD. Fracture risk is increased. RECOMMENDATIONS: 1. All patients  should optimize calcium and vitamin D  intake. 2. Consider FDA-approved medical therapies in postmenopausal women and men aged 53 years and older, based on the following: - A hip or vertebral (clinical or morphometric) fracture - T-score less than or equal to -2.5 and secondary causes have been excluded. - Low bone mass (T-score between -1.0 and -2.5) and a 10-year probability of a hip fracture greater than or equal to 3% or a 10-year probability of a major osteoporosis-related fracture greater than or equal to 20% based on the [place] -adapted WHO algorithm. - Clinician judgment and/or patient preferences may indicate treatment for people with 10-year fracture probabilities above or below these levels 3. Patients with diagnosis of osteoporosis or at high risk for fracture should have regular bone mineral density tests. For patients eligible for Medicare, routine testing is allowed once every 2 years. The testing frequency can be increased to one year for patients who have rapidly progressing disease, those who are receiving or discontinuing medical therapy to restore bone mass, or have additional risk factors. Electronically Signed   By: Sundra Engel M.D.   On: 09/08/2023 14:42       Assessment & Plan:  Family history of colon cancer Assessment & Plan: Colonoscopy 05/2017 - diverticulosis.  Recommended f/u in 10 years. States - maternal uncle with colon cancer.     Hypercholesterolemia Assessment & Plan: The 10-year ASCVD risk score (Arnett DK, et al., 2019) is: 6.2%   Values used to calculate the score:     Age: 57 years     Sex: Female     Is Non-Hispanic African American: No     Diabetic: No     Tobacco smoker: No     Systolic Blood Pressure: 108 mmHg     Is BP treated: No     HDL Cholesterol: 76.5 mg/dL     Total Cholesterol: 251 mg/dL  Low cholesterol diet and exercise.  Follow lipid panel. Discussed calculated cholesterol risk. Discussed calcium score 0 in 2021. Discussed f/u calcium score. Will  notify me if desires to schedule.   Orders: -     Lipid panel; Future -     Hepatic function panel; Future -     Basic metabolic panel with GFR; Future -     CBC with Differential/Platelet; Future -     TSH; Future  Hot flashes Assessment & Plan: Off oral estrogen/patch. Discussed with her today  Have discussed being placed back on estrogen and have discussed a trial of effexor.  Wants to hold on any further intervention at this time.    Osteopenia, unspecified location Assessment & Plan:  Saw endocrinology 09/16/23 - recent bone denstiy - osteopenia. Recommended calcium, vitamin D  and weight bearing exercise. Recommended f/u bone density in 2027.  Discussed bone density.  Discussed calcium, vitamin D  and weightbearing exercise.  Information given on foods with increased calcium.     Stress Assessment & Plan: Increased stress as outlined.  Discussed.    Other orders -     Estradiol ; Use one applicator q day x 5 days and then continue two times per week.  Dispense: 42.5 g; Refill: 2     Dellar Fenton, MD

## 2023-09-29 NOTE — Assessment & Plan Note (Signed)
Colonoscopy 05/2017 - diverticulosis.  Recommended f/u in 10 years. States - maternal uncle with colon cancer.   

## 2023-10-16 DIAGNOSIS — L923 Foreign body granuloma of the skin and subcutaneous tissue: Secondary | ICD-10-CM | POA: Diagnosis not present

## 2024-01-12 DIAGNOSIS — H61301 Acquired stenosis of right external ear canal, unspecified: Secondary | ICD-10-CM | POA: Diagnosis not present

## 2024-01-12 DIAGNOSIS — H6121 Impacted cerumen, right ear: Secondary | ICD-10-CM | POA: Diagnosis not present

## 2024-02-23 ENCOUNTER — Telehealth: Payer: Self-pay | Admitting: Internal Medicine

## 2024-04-05 ENCOUNTER — Other Ambulatory Visit

## 2024-04-05 DIAGNOSIS — E7800 Pure hypercholesterolemia, unspecified: Secondary | ICD-10-CM

## 2024-04-05 LAB — LIPID PANEL
Cholesterol: 259 mg/dL — ABNORMAL HIGH (ref 0–200)
HDL: 83 mg/dL (ref >39.00)
LDL Cholesterol: 164 mg/dL — ABNORMAL HIGH (ref 0–99)
NonHDL: 176.3
Total CHOL/HDL Ratio: 3
Triglycerides: 61 mg/dL (ref 0.0–149.0)
VLDL: 12.2 mg/dL (ref 0.0–40.0)

## 2024-04-05 LAB — HEPATIC FUNCTION PANEL
ALT: 14 U/L (ref 0–35)
AST: 20 U/L (ref 0–37)
Albumin: 4.6 g/dL (ref 3.5–5.2)
Alkaline Phosphatase: 63 U/L (ref 39–117)
Bilirubin, Direct: 0.2 mg/dL (ref 0.0–0.3)
Total Bilirubin: 1 mg/dL (ref 0.2–1.2)
Total Protein: 6.9 g/dL (ref 6.0–8.3)

## 2024-04-05 LAB — CBC WITH DIFFERENTIAL/PLATELET
Basophils Absolute: 0 K/uL (ref 0.0–0.1)
Basophils Relative: 0.4 % (ref 0.0–3.0)
Eosinophils Absolute: 0.1 K/uL (ref 0.0–0.7)
Eosinophils Relative: 1.2 % (ref 0.0–5.0)
HCT: 37.2 % (ref 36.0–46.0)
Hemoglobin: 12.5 g/dL (ref 12.0–15.0)
Lymphocytes Relative: 29.8 % (ref 12.0–46.0)
Lymphs Abs: 1.5 K/uL (ref 0.7–4.0)
MCHC: 33.7 g/dL (ref 30.0–36.0)
MCV: 92.6 fl (ref 78.0–100.0)
Monocytes Absolute: 0.6 K/uL (ref 0.1–1.0)
Monocytes Relative: 12.3 % — ABNORMAL HIGH (ref 3.0–12.0)
Neutro Abs: 2.9 K/uL (ref 1.4–7.7)
Neutrophils Relative %: 56.3 % (ref 43.0–77.0)
Platelets: 304 K/uL (ref 150.0–400.0)
RBC: 4.02 Mil/uL (ref 3.87–5.11)
RDW: 12.5 % (ref 11.5–15.5)
WBC: 5.1 K/uL (ref 4.0–10.5)

## 2024-04-05 LAB — BASIC METABOLIC PANEL WITH GFR
BUN: 17 mg/dL (ref 6–23)
CO2: 30 meq/L (ref 19–32)
Calcium: 9.6 mg/dL (ref 8.4–10.5)
Chloride: 105 meq/L (ref 96–112)
Creatinine, Ser: 0.66 mg/dL (ref 0.40–1.20)
GFR: 89.23 mL/min (ref >60.00)
Glucose, Bld: 88 mg/dL (ref 70–99)
Potassium: 4.2 meq/L (ref 3.5–5.1)
Sodium: 142 meq/L (ref 135–145)

## 2024-04-05 LAB — TSH: TSH: 2.21 u[IU]/mL (ref 0.35–5.50)

## 2024-04-06 ENCOUNTER — Ambulatory Visit: Payer: Self-pay | Admitting: Internal Medicine

## 2024-04-07 ENCOUNTER — Ambulatory Visit: Admitting: Internal Medicine

## 2024-04-07 ENCOUNTER — Encounter: Payer: Self-pay | Admitting: Internal Medicine

## 2024-04-07 VITALS — BP 100/78 | HR 72 | Temp 98.2°F | Ht 63.0 in | Wt 117.2 lb

## 2024-04-07 DIAGNOSIS — Z1231 Encounter for screening mammogram for malignant neoplasm of breast: Secondary | ICD-10-CM | POA: Diagnosis not present

## 2024-04-07 DIAGNOSIS — Z136 Encounter for screening for cardiovascular disorders: Secondary | ICD-10-CM

## 2024-04-07 DIAGNOSIS — E7800 Pure hypercholesterolemia, unspecified: Secondary | ICD-10-CM

## 2024-04-07 DIAGNOSIS — F439 Reaction to severe stress, unspecified: Secondary | ICD-10-CM

## 2024-04-07 DIAGNOSIS — R232 Flushing: Secondary | ICD-10-CM

## 2024-04-07 DIAGNOSIS — Z0000 Encounter for general adult medical examination without abnormal findings: Secondary | ICD-10-CM

## 2024-04-07 DIAGNOSIS — M810 Age-related osteoporosis without current pathological fracture: Secondary | ICD-10-CM

## 2024-04-07 MED ORDER — ESTRADIOL 0.05 MG/24HR TD PTTW: 1.0000 | MEDICATED_PATCH | TRANSDERMAL | 12 refills | Status: AC

## 2024-04-07 NOTE — Assessment & Plan Note (Signed)
 The 10-year ASCVD risk score (Arnett DK, et al., 2019) is: 6.1%   Values used to calculate the score:     Age: 68 years     Clinically relevant sex: Female     Is Non-Hispanic African American: No     Diabetic: No     Tobacco smoker: No     Systolic Blood Pressure: 108 mmHg     Is BP treated: No     HDL Cholesterol: 83 mg/dL     Total Cholesterol: 259 mg/dL  Low cholesterol diet and exercise.  Follow lipid panel. Discussed calculated cholesterol risk. Discussed calcium score 0 in 2021. Discussed f/u calcium score. Will notify me if desires to schedule.

## 2024-04-07 NOTE — Progress Notes (Unsigned)
 Subjective:    Patient ID: Lisa Chen, female    DOB: 20-Dec-1955, 68 y.o.   MRN: [id number]  Patient here for  Chief Complaint  Patient presents with   Medical Management of Chronic Issues   Annual Exam    HPI Here for a physical exam. Saw endocrinology 09/16/23 - recent bone denstiy - osteopenia. Recommended calcium, vitamin D  and weight bearing exercise. Recommended f/u bone density in 2027.    Past Medical History:  Diagnosis Date   Cancer (HCC)    melanoma   Cataract    Headache    3/WEEK, / DUE TO EYELIDS   Osteopenia    NOT PROGRESSED IN LAST 10 YEARS   Ptosis, both eyelids    UPPER   Past Surgical History:  Procedure Laterality Date   ABDOMINAL HYSTERECTOMY  1995   APPENDECTOMY     AUGMENTATION MAMMAPLASTY Bilateral 2000   SALINE   BREAST BIOPSY Left 05/13/2022   us  biopsy/ribbon clip/ benign   BREAST BIOPSY Left 05/13/2022   US  LT BREAST BX W LOC DEV 1ST LESION IMG BX SPEC US  GUIDE 05/13/2022 [facility]   BREAST EXCISIONAL BIOPSY Right 1990's   NEG   BREAST SURGERY Right    BIOPSY   BROW LIFT Bilateral 04/03/2015   Procedure: BLEPHAROPLASTY;  Surgeon: Greig CHRISTELLA Gay, MD;  Location: [facility];  Service: Ophthalmology;  Laterality: Bilateral;   CERVICAL DISC SURGERY  2001   COLONOSCOPY WITH PROPOFOL  N/A 06/24/2017   Procedure: COLONOSCOPY WITH PROPOFOL ;  Surgeon: Dessa Reyes ORN, MD;  Location: [facility];  Service: Endoscopy;  Laterality: N/A;   COSMETIC SURGERY     SKIN CANCER-MELANOMA ON BACK REMOVED-BASAL CELL ON CHEST ALSO   EYE SURGERY Bilateral 2000 AND 2002   CATARACTS   FOOT SURGERY     HERNIA REPAIR  1984   PTOSIS REPAIR Bilateral 04/03/2015   Procedure: PTOSIS REPAIR;  Surgeon: Greig CHRISTELLA Gay, MD;  Location: [facility];  Service: Ophthalmology;  Laterality: Bilateral;   skin cancer removed  06/2023   shoulder   SPINE SURGERY  neck surgery - 2001   TONSILLECTOMY     Family History  Problem Relation Age of  Onset   Arthritis Mother    Heart disease Mother    Diabetes Mother    Heart attack Father    Heart disease Maternal Grandmother    Heart disease Maternal Grandfather    Diabetes Maternal Grandfather    Colon cancer Cousin    Colon cancer Maternal Uncle    Heart attack Maternal Uncle    Stroke Maternal Uncle    Breast cancer Neg Hx    Social History   Socioeconomic History   Marital status: Married    Spouse name: Not on file   Number of children: Not on file   Years of education: Not on file   Highest education level: 12th grade  Occupational History   Not on file  Tobacco Use   Smoking status: Never   Smokeless tobacco: Never  Vaping Use   Vaping status: Never Used  Substance and Sexual Activity   Alcohol use: Not Currently    Alcohol/week: 3.0 standard drinks of alcohol    Comment: OCCAS. none last 24hrs   Drug use: Never   Sexual activity: Yes    Birth control/protection: None  Other Topics Concern   Not on file  Social History Narrative   married   Social Drivers of Health   Tobacco Use: Low Risk (  04/07/2024)   Patient History    Smoking Tobacco Use: Never    Smokeless Tobacco Use: Never    Passive Exposure: Not on file  Financial Resource Strain: Low Risk (04/03/2024)   Overall Financial Resource Strain (CARDIA)    Difficulty of Paying Living Expenses: Not hard at all  Food Insecurity: No Food Insecurity (04/03/2024)   Epic    Worried About Programme Researcher, Broadcasting/film/video in the Last Year: Never true    Ran Out of Food in the Last Year: Never true  Transportation Needs: No Transportation Needs (04/03/2024)   Epic    Lack of Transportation (Medical): No    Lack of Transportation (Non-Medical): No  Physical Activity: Sufficiently Active (04/03/2024)   Exercise Vital Sign    Days of Exercise per Week: 5 days    Minutes of Exercise per Session: 30 min  Stress: No Stress Concern Present (04/03/2024)   [employer] of Occupational Health - Occupational Stress  Questionnaire    Feeling of Stress: Only a little  Social Connections: Socially Integrated (04/03/2024)   Social Connection and Isolation Panel    Frequency of Communication with Friends and Family: More than three times a week    Frequency of Social Gatherings with Friends and Family: More than three times a week    Attends Religious Services: More than 4 times per year    Active Member of Clubs or Organizations: Yes    Attends Banker Meetings: More than 4 times per year    Marital Status: Married  Depression (PHQ2-9): Low Risk (04/07/2024)   Depression (PHQ2-9)    PHQ-2 Score: 0  Alcohol Screen: Low Risk (08/25/2023)   Alcohol Screen    Last Alcohol Screening Score (AUDIT): 3  Housing: Unknown (04/03/2024)   Epic    Unable to Pay for Housing in the Last Year: No    Number of Times Moved in the Last Year: Not on file    Homeless in the Last Year: No  Utilities: Not At Risk (08/25/2023)   [facility] Utilities    Threatened with loss of utilities: No  Health Literacy: Adequate Health Literacy (08/25/2023)   B1300 Health Literacy    Frequency of need for help with medical instructions: Never     Review of Systems     Objective:     BP 100/78   Pulse 72   Temp 98.2 F (36.8 C) (Oral)   Ht 5' 3 (1.6 m)   Wt 117 lb 3.2 oz (53.2 kg)   SpO2 95%   BMI 20.76 kg/m  Wt Readings from Last 3 Encounters:  04/07/24 117 lb 3.2 oz (53.2 kg)  09/29/23 117 lb 9.6 oz (53.3 kg)  08/25/23 114 lb (51.7 kg)    Physical Exam  {Perform Simple Foot Exam  Perform Detailed exam:1} {Insert foot Exam (Optional):30965}   Outpatient Encounter Medications as of 04/07/2024  Medication Sig   calcium carbonate (OSCAL) 1500 (600 Ca) MG TABS tablet Take by mouth. Takes 5 times a week   cholecalciferol (VITAMIN D3) 25 MCG (1000 UNIT) tablet Take 1,000 Units by mouth. Takes about 5 times a week   estradiol  (VIVELLE -DOT) 0.05 MG/24HR patch Place 1 patch (0.05 mg total) onto the skin 2 (two)  times a week.   [DISCONTINUED] estradiol  (ESTRACE  VAGINAL) 0.1 MG/GM vaginal cream Use one applicator q day x 5 days and then continue two times per week.   No facility-administered encounter medications on file as of 04/07/2024.  Lab Results  Component Value Date   WBC 5.1 04/05/2024   HGB 12.5 04/05/2024   HCT 37.2 04/05/2024   PLT 304.0 04/05/2024   GLUCOSE 88 04/05/2024   CHOL 259 (H) 04/05/2024   TRIG 61.0 04/05/2024   HDL 83.00 04/05/2024   LDLCALC 164 (H) 04/05/2024   ALT 14 04/05/2024   AST 20 04/05/2024   NA 142 04/05/2024   K 4.2 04/05/2024   CL 105 04/05/2024   CREATININE 0.66 04/05/2024   BUN 17 04/05/2024   CO2 30 04/05/2024   TSH 2.21 04/05/2024    DG BONE DENSITY (DXA) Result Date: 09/08/2023 EXAM: DUAL X-RAY ABSORPTIOMETRY (DXA) FOR BONE MINERAL DENSITY 09/08/2023 11:24 am CLINICAL DATA:  68 year old Female Postmenopausal. Screening for osteoporosis TECHNIQUE: An axial (e.g., hips, spine) and/or appendicular (e.g., radius) exam was performed, as appropriate, using GE Secretary/administrator at [facility]. Images are obtained for bone mineral density measurement and are not obtained for diagnostic purposes. [id number] Exclusions: Lumbar spine due to advanced degenerative changes. COMPARISON:  05/07/2010, 04/16/2021. FINDINGS: Scan quality: Good. LEFT FEMORAL NECK: BMD (in g/cm2): 0.755 T-score: -2.0 Z-score: -0.4 Rate of change from previous exam: -9.6 % LEFT TOTAL HIP: BMD (in g/cm2): 0.803 T-score: -1.6 Z-score: -0.2 RIGHT FEMORAL NECK: BMD (in g/cm2): 0.814 T-score: -1.6 Z-score: 0.0 RIGHT TOTAL HIP: BMD (in g/cm2): 0.829 T-score: -1.4 Z-score: 0.0 DUAL-FEMUR TOTAL MEAN: Rate of change from previous exam: -3.2 % LEFT FOREARM (RADIUS 33%): BMD (in g/cm2): 0.711 T-score: -1.9 Z-score: -0.2 Rate of change from previous exam: -16.1 % FRAX 10-YEAR PROBABILITY OF FRACTURE: 10-year fracture risk is performed using the [facility] FRAX  calculator based on patient-reported risk factors. Major osteoporotic fracture: 10.5% Hip fracture: 2.2% Other situations known to alter the reliability of the FRAX score should be considered when making treatment decisions, including chronic glucocorticoid use and past treatments. Further guidance on treatment can be found at the [facility] Osteoporosis Foundation's website https://www.patton.com/. IMPRESSION: Osteopenia based on BMD. Fracture risk is increased. RECOMMENDATIONS: 1. All patients should optimize calcium and vitamin D  intake. 2. Consider FDA-approved medical therapies in postmenopausal women and men aged 67 years and older, based on the following: - A hip or vertebral (clinical or morphometric) fracture - T-score less than or equal to -2.5 and secondary causes have been excluded. - Low bone mass (T-score between -1.0 and -2.5) and a 10-year probability of a hip fracture greater than or equal to 3% or a 10-year probability of a major osteoporosis-related fracture greater than or equal to 20% based on the [place] -adapted WHO algorithm. - Clinician judgment and/or patient preferences may indicate treatment for people with 10-year fracture probabilities above or below these levels 3. Patients with diagnosis of osteoporosis or at high risk for fracture should have regular bone mineral density tests. For patients eligible for Medicare, routine testing is allowed once every 2 years. The testing frequency can be increased to one year for patients who have rapidly progressing disease, those who are receiving or discontinuing medical therapy to restore bone mass, or have additional risk factors. Electronically Signed   By: Reyes Phi M.D.   On: 09/08/2023 14:42       Assessment & Plan:  Health care maintenance Assessment & Plan: Physical today 04/07/24.  PAP 03/11/21 - negative HPV. Atrophy.  Mammogram 04/28/23 Birads I.  Schedule f/u mammogram. Colonoscopy 05/2017.  Recommended f/up in 10 years.     Hypercholesterolemia Assessment & Plan: The 10-year ASCVD risk  score (Arnett DK, et al., 2019) is: 6.1%   Values used to calculate the score:     Age: 89 years     Clinically relevant sex: Female     Is Non-Hispanic African American: No     Diabetic: No     Tobacco smoker: No     Systolic Blood Pressure: 108 mmHg     Is BP treated: No     HDL Cholesterol: 83 mg/dL     Total Cholesterol: 259 mg/dL  Low cholesterol diet and exercise.  Follow lipid panel. Discussed calculated cholesterol risk. Discussed calcium score 0 in 2021. Discussed f/u calcium score. Will notify me if desires to schedule.   Orders: -     Lipid panel; Future -     Hepatic function panel; Future -     Basic metabolic panel with GFR; Future  Encounter for screening for coronary artery disease -     CT CARDIAC SCORING (SELF PAY ONLY); Future  Other orders -     Estradiol ; Place 1 patch (0.05 mg total) onto the skin 2 (two) times a week.  Dispense: 8 patch; Refill: 12     Allena Hamilton, MD

## 2024-04-07 NOTE — Assessment & Plan Note (Signed)
 Physical today 04/07/24.  PAP 03/11/21 - negative HPV. Atrophy.  Mammogram 04/28/23 Birads I.  Schedule f/u mammogram. Colonoscopy 05/2017.  Recommended f/up in 10 years.

## 2024-04-08 ENCOUNTER — Other Ambulatory Visit: Payer: Self-pay | Admitting: Internal Medicine

## 2024-04-08 DIAGNOSIS — Z1231 Encounter for screening mammogram for malignant neoplasm of breast: Secondary | ICD-10-CM

## 2024-04-17 ENCOUNTER — Encounter: Payer: Self-pay | Admitting: Internal Medicine

## 2024-04-17 NOTE — Assessment & Plan Note (Signed)
 Estrogen patch. Follow.

## 2024-04-17 NOTE — Assessment & Plan Note (Signed)
Overall appears to be handling things relatively well. 

## 2024-04-17 NOTE — Assessment & Plan Note (Signed)
"   Saw endocrinology 09/16/23 - recent bone denstiy - osteopenia. Recommended calcium, vitamin D  and weight bearing exercise. Recommended f/u bone density in 2027.  "

## 2024-04-29 ENCOUNTER — Ambulatory Visit
Admission: RE | Admit: 2024-04-29 | Discharge: 2024-04-29 | Disposition: A | Discharge status: Home / Self Care | Source: Ambulatory Visit | Attending: Internal Medicine | Admitting: Internal Medicine

## 2024-04-29 DIAGNOSIS — Z1231 Encounter for screening mammogram for malignant neoplasm of breast: Secondary | ICD-10-CM | POA: Insufficient documentation

## 2024-05-16 ENCOUNTER — Ambulatory Visit
Admission: RE | Admit: 2024-05-16 | Discharge: 2024-05-16 | Disposition: A | Discharge status: Home / Self Care | Payer: Self-pay | Source: Ambulatory Visit | Attending: Internal Medicine | Admitting: Internal Medicine

## 2024-05-16 DIAGNOSIS — Z136 Encounter for screening for cardiovascular disorders: Secondary | ICD-10-CM | POA: Insufficient documentation

## 2024-05-17 ENCOUNTER — Ambulatory Visit: Payer: Self-pay | Admitting: Internal Medicine

## 2024-08-09 ENCOUNTER — Other Ambulatory Visit

## 2024-08-11 ENCOUNTER — Ambulatory Visit: Admitting: Internal Medicine

## 2024-08-30 ENCOUNTER — Ambulatory Visit
